# Patient Record
Sex: Male | Born: 1965 | Race: Black or African American | Hispanic: No | Marital: Married | Smoking: Current some day smoker
Health system: Southern US, Community
[De-identification: ages and names within clinical notes are randomized; demographics above are authoritative.]

## PROBLEM LIST (undated history)

## (undated) DIAGNOSIS — H60331 Swimmer's ear, right ear: Secondary | ICD-10-CM

## (undated) DIAGNOSIS — K219 Gastro-esophageal reflux disease without esophagitis: Secondary | ICD-10-CM

## (undated) DIAGNOSIS — Q8500 Neurofibromatosis, unspecified: Secondary | ICD-10-CM

## (undated) DIAGNOSIS — G4730 Sleep apnea, unspecified: Secondary | ICD-10-CM

## (undated) DIAGNOSIS — I10 Essential (primary) hypertension: Secondary | ICD-10-CM

## (undated) HISTORY — PX: OTHER SURGICAL HISTORY: SHX169

## (undated) HISTORY — PX: COLON SURGERY: SHX602

## (undated) HISTORY — DX: Swimmer's ear, right ear: H60.331

---

## 2012-02-27 HISTORY — PX: CARDIAC CATHETERIZATION: SHX172

## 2014-04-07 DIAGNOSIS — Z87442 Personal history of urinary calculi: Secondary | ICD-10-CM

## 2014-04-07 HISTORY — DX: Personal history of urinary calculi: Z87.442

## 2015-05-15 DIAGNOSIS — M25562 Pain in left knee: Secondary | ICD-10-CM | POA: Insufficient documentation

## 2016-05-08 DIAGNOSIS — D334 Benign neoplasm of spinal cord: Secondary | ICD-10-CM | POA: Insufficient documentation

## 2016-05-08 DIAGNOSIS — M461 Sacroiliitis, not elsewhere classified: Secondary | ICD-10-CM | POA: Insufficient documentation

## 2018-07-09 ENCOUNTER — Emergency Department (HOSPITAL_COMMUNITY)
Admission: EM | Admit: 2018-07-09 | Discharge: 2018-07-09 | Disposition: A | Discharge status: Home / Self Care | Payer: Managed Care, Other (non HMO) | Attending: Emergency Medicine | Admitting: Emergency Medicine

## 2018-07-09 ENCOUNTER — Encounter (HOSPITAL_COMMUNITY): Payer: Self-pay | Admitting: Emergency Medicine

## 2018-07-09 ENCOUNTER — Other Ambulatory Visit: Payer: Self-pay

## 2018-07-09 DIAGNOSIS — Z87891 Personal history of nicotine dependence: Secondary | ICD-10-CM | POA: Insufficient documentation

## 2018-07-09 DIAGNOSIS — I10 Essential (primary) hypertension: Secondary | ICD-10-CM | POA: Insufficient documentation

## 2018-07-09 DIAGNOSIS — M791 Myalgia, unspecified site: Secondary | ICD-10-CM

## 2018-07-09 DIAGNOSIS — M545 Low back pain: Secondary | ICD-10-CM | POA: Diagnosis present

## 2018-07-09 HISTORY — DX: Essential (primary) hypertension: I10

## 2018-07-09 LAB — URINALYSIS, ROUTINE W REFLEX MICROSCOPIC
Bilirubin Urine: NEGATIVE
Glucose, UA: NEGATIVE mg/dL
Hgb urine dipstick: NEGATIVE
Ketones, ur: NEGATIVE mg/dL
Leukocytes,Ua: NEGATIVE
Nitrite: NEGATIVE
Protein, ur: NEGATIVE mg/dL
Specific Gravity, Urine: 1.025 (ref 1.005–1.030)
pH: 5 (ref 5.0–8.0)

## 2018-07-09 NOTE — ED Notes (Signed)
Pt anxious to leave. Pt left ED after MD gave pt discharge paperwork.  MD states she reviewed information with patient prior to discharge

## 2018-07-09 NOTE — ED Provider Notes (Signed)
[facility] EMERGENCY DEPARTMENT Provider Note   CSN: [id number] Arrival date & time: 07/09/18  2104    History   Chief Complaint Chief Complaint  Patient presents with  . Back Pain    HPI Taheem Fricke is a 53 y.o. male with history of chronic right low back pain is here for evaluation of bilateral thoracic back pain described as "ache", gnawing, constant, moderate pain.  Onset around 1-2PM today while he was sitting down, several hours after he went fishing.  The pain is constant but significantly worse with movement.  Slightly better if he sits still.  Feels like the pain is now diffuse to his upper trapezius, proximal arms, buttocks and thighs.  Took his morning dose of ibuprofen gabapentin for his chronic back pain but no other interventions.  He denies any falls or trauma.  States recently he changed to a new mattress at home.  He denies any recent injections or surgeries to the back.  He denies any associated headache, congestion, sore throat, cough, chest pain, shortness of breath, abdominal pain, nausea, vomiting, diarrhea.  No saddle anesthesia, no loss of bladder/bowel control, paresthesias or loss of sensation/strength to his extremities.  No fever.  He is concerned that he may be coming down with coronavirus infection.  He feels like his neck lymph nodes are swollen.      HPI  Past Medical History:  Diagnosis Date  . Hypertension     There are no active problems to display for this patient.   History reviewed. No pertinent surgical history.      Home Medications    Prior to Admission medications   Not on File    Family History No family history on file.  Social History Social History   Tobacco Use  . Smoking status: Former Research scientist (life sciences)  . Smokeless tobacco: Never Used  Substance Use Topics  . Alcohol use: Not Currently    Frequency: Never  . Drug use: Never     Allergies   Shellfish allergy   Review of Systems Review of Systems   Musculoskeletal: Positive for myalgias.  All other systems reviewed and are negative.    Physical Exam Updated Vital Signs BP (!) 140/97 (BP Location: Right Arm)   Pulse 91   Temp 98 F (36.7 C) (Oral)   Resp 16   SpO2 96%   Physical Exam Vitals signs and nursing note reviewed.  Constitutional:      Appearance: He is well-developed.     Comments: Non toxic.  HENT:     Head: Normocephalic and atraumatic.     Nose: Nose normal.     Comments: Intranasal mucosa normal.  No rhinorrhea.    Mouth/Throat:     Comments: Moist mucous membranes.  Oropharynx and tonsils normal. Eyes:     Conjunctiva/sclera: Conjunctivae normal.  Neck:     Musculoskeletal: Normal range of motion.     Comments: No cervical lymphadenopathy.  Trachea midline.  Full range of motion of the neck without pain or rigidity. Cardiovascular:     Rate and Rhythm: Normal rate and regular rhythm.     Heart sounds: Normal heart sounds.     Comments: 1+ radial and DP pulses bilaterally. Pulmonary:     Effort: Pulmonary effort is normal.     Breath sounds: Normal breath sounds.  Abdominal:     General: Bowel sounds are normal.     Palpations: Abdomen is soft.     Tenderness: There is no abdominal  tenderness.     Comments: Soft.  No pulsatility.  NT.  No G/R/R. No suprapubic or CVA tenderness. Negative Murphy's and McBurney's  Musculoskeletal: Normal range of motion.     Comments: TL spine: No midline tenderness.  No reproducible paraspinal muscle tenderness.  No increased tone.  Skin:    General: Skin is warm and dry.     Capillary Refill: Capillary refill takes less than 2 seconds.  Neurological:     Mental Status: He is alert.     Comments: Sensation to light touch intact in face, upper and lower extremities.  Strength against resistance intact in upper and lower extremities.  Psychiatric:        Behavior: Behavior normal.      ED Treatments / Results  Labs (all labs ordered are listed, but only  abnormal results are displayed) Labs Reviewed  URINALYSIS, ROUTINE W REFLEX MICROSCOPIC    EKG None  Radiology No results found.  Procedures Procedures (including critical care time)  Medications Ordered in ED Medications - No data to display   Initial Impression / Assessment and Plan / ED Course  I have reviewed the triage vital signs and the nursing notes.  Pertinent labs & imaging results that were available during my care of the patient were reviewed by me and considered in my medical decision making (see chart for details). Willmer Fellers was evaluated in Emergency Department on 07/09/2018 for the symptoms described in the history of present illness. He was evaluated in the context of the global COVID-19 pandemic, which necessitated consideration that the patient might be at risk for infection with the SARS-CoV-2 virus that causes COVID-19. Institutional protocols and algorithms that pertain to the evaluation of patients at risk for COVID-19 are in a state of rapid change based on information released by regulatory bodies including the CDC and federal and state organizations. These policies and algorithms were followed during the patient's care in the ED.      Highest on DDX is muscular spasm versus overuse injury.  Describes diffuse myalgias so also considered early viral process now only presenting itself with myalgias.  He reports subjective swelling to his lymph nodes in his neck.  Also recently changed his mattress more suggestive of MSK etioloy.  Exam is otherwise reassuring.  No midline spinal tenderness.  Strength/sensation intact.  Abdomen benign without pulsatility, suprapubic or CVA tenderness.  Distal pulses symmetric bilaterally.  No red flag features of back pain present such as saddle anesthesia, bladder/bowel incontinence or retention, fevers, h/o cancer, IVDU, preceding trauma or falls, neuro deficits, urinary symptoms.  I have low suspicion for UTI/pyelo, kidney stone,  cauda equina, epidural abscess, dissection as these don't fit clinical picture. Emergent imaging not thought to be indicated today as physical exam reassuring.  Will recommend rest, NSAIDs. Pt concerned about coronavirus infection but has no frank symptoms of this, h/o travel or exposure.  It is possible his myalgias may be the first symptom. Provided reassurance. Pt understands there is no indication for formal testing today.  Pt understands signs and symptoms that would warrant return to ED.  Recommended PCP telemedicine visit for further guidance and self isolation instructions in case he develops symptoms.   Final Clinical Impressions(s) / ED Diagnoses   Final diagnoses:  Myalgia    ED Discharge Orders    None       Kinnie Feil, PA-C 07/09/18 [place], DO 07/10/18 1511

## 2018-07-09 NOTE — ED Triage Notes (Signed)
Pt c/o mid-back pain that started today. Denies urinary symptoms. No chest pain/shortness of breath or fevers.

## 2018-07-09 NOTE — Discharge Instructions (Addendum)
You were seen in the ED for body aches in back, buttocks, legs.   Urine looked normal.  You had no other concerning symptoms today other than body pain.   This may be due to recent mattress change, fishing.  This could be an early viral infection IF you develop virus symptoms like congestion, cough, sore throat, chills, fevers, nausea, vomiting, abdominal pain.   We discussed COVID-19.  Right now you have no symptoms of this and the likelihood that you have it is low. If you do develop symptoms such as fever, cough, congestion, sore throat, nausea, vomiting, headaches you may have it.  If symptoms are mild, stay home and self isolate for a total of 7 days since symptoms began AND 3 days of no fever before you resume essential activities.  If your symptoms worsen or are severe, there is shortness of breath, increased work of breathing, dehydration return to ED.  Take ibuprofen and gabapentin as usual.  Can add [phone] mg acetaminophen for more ache control

## 2018-07-28 ENCOUNTER — Encounter: Payer: Self-pay | Admitting: Family Medicine

## 2018-07-28 ENCOUNTER — Ambulatory Visit (INDEPENDENT_AMBULATORY_CARE_PROVIDER_SITE_OTHER): Payer: Managed Care, Other (non HMO) | Admitting: Family Medicine

## 2018-07-28 DIAGNOSIS — Z1211 Encounter for screening for malignant neoplasm of colon: Secondary | ICD-10-CM

## 2018-07-28 DIAGNOSIS — R413 Other amnesia: Secondary | ICD-10-CM

## 2018-07-28 DIAGNOSIS — Z87898 Personal history of other specified conditions: Secondary | ICD-10-CM

## 2018-07-28 DIAGNOSIS — Z860100 Personal history of colon polyps, unspecified: Secondary | ICD-10-CM | POA: Insufficient documentation

## 2018-07-28 DIAGNOSIS — Q8500 Neurofibromatosis, unspecified: Secondary | ICD-10-CM

## 2018-07-28 DIAGNOSIS — K219 Gastro-esophageal reflux disease without esophagitis: Secondary | ICD-10-CM | POA: Diagnosis not present

## 2018-07-28 DIAGNOSIS — Z7689 Persons encountering health services in other specified circumstances: Secondary | ICD-10-CM | POA: Diagnosis not present

## 2018-07-28 DIAGNOSIS — I10 Essential (primary) hypertension: Secondary | ICD-10-CM | POA: Diagnosis not present

## 2018-07-28 MED ORDER — LOSARTAN POTASSIUM-HCTZ 100-25 MG PO TABS: 1.0000 | ORAL_TABLET | Freq: Every day | ORAL | 3 refills | Status: DC

## 2018-07-28 MED ORDER — OMEPRAZOLE 20 MG PO CPDR: 20.0000 mg | DELAYED_RELEASE_CAPSULE | Freq: Every day | ORAL | 3 refills | Status: DC

## 2018-07-28 MED ORDER — AMLODIPINE BESYLATE 10 MG PO TABS: 10.0000 mg | ORAL_TABLET | Freq: Every day | ORAL | 3 refills | Status: DC

## 2018-07-28 NOTE — Progress Notes (Signed)
Virtual Visit via Video Note  I connected with Rollins Wrightson on 07/28/18 at 11:30 AM EDT by a video enabled telemedicine application and verified that I am speaking with the correct person using two identifiers. Location patient: home Location provider: home office Persons participating in the virtual visit: patient, provider  I discussed the limitations of evaluation and management by telemedicine and the availability of in person appointments. The patient expressed understanding and agreed to proceed.  Chief Complaint  Patient presents with  . Establish Care    est care/ referral to GI? wants blood work? (hx of colon polyps) hasn't taken BP having some head pain.    HPI: Marcus Armstrong is a 53 y.o. male to establish care with our office. His PMH is significant for HTN, substance abuse, and ? Neurofibromatosis. He also endorses a h/o a colon polyp found about 9 years ago on colonoscopy done for rectal bleeding. He had 2 f/u colo since that time and is now due for another 3 year f/u colonoscopy.  Pt is from [facility] and has been in [place] for a few months. He was recently discharged from [facility] about 3 weeks ago where he was receiving inpatient treatment for substance abuse. He is now involved in a intensive outpatient program 3 hours/day 3 days/wk. He is out of work on short-term disability which is set to end on 08/16/18.   Pt has a h/o HTN and is currently on norvasc 10mg  daily, losartan 100mg  daily, and HCTZ 12.5mg  daily. The HCTZ was added about 4 weeks wgo while is was in [employer]. He states his BP was still high and he was told to f/u on this. He believes his last BP readings there were 140-150's/90-100. He was seen in [facility] ER on 4/3 for myalgias and back pain and BP was 140/97 at that time.   Pt requests a referral to neurology due to a h/o ? Neurofibromatosis. He states up until 2014 he was getting annual MRI of brain and spine to monitor over 30 tumors that are present. He also notes  having more difficulty in the past 6-12 mo with memory, recall, word finding. His mother has dementia so pt is concerned about this in himself and also wonders if his h/o substance abuse is a contributing factor.   Patient Active Problem List   Diagnosis Date Noted  . Personal history of colonic polyps 07/28/2018  . Essential hypertension 07/28/2018  . Gastroesophageal reflux disease 07/28/2018    Past Medical History:  Diagnosis Date  . Hypertension     Past Surgical History:  Procedure Laterality Date  . polyp removal      Family History  Problem Relation Age of Onset  . Cancer Father 28       prostate    Social History   Tobacco Use  . Smoking status: Former Research scientist (life sciences)  . Smokeless tobacco: Never Used  Substance Use Topics  . Alcohol use: Not Currently    Frequency: Never  . Drug use: Never     Current Outpatient Medications:  .  amLODipine (NORVASC) 10 MG tablet, Take 1 tablet (10 mg total) by mouth daily., Disp: 90 tablet, Rfl: 3 .  fluticasone (FLONASE) 50 MCG/ACT nasal spray, Place into both nostrils daily., Disp: , Rfl:  .  gabapentin (NEURONTIN) 300 MG capsule, , Disp: , Rfl:  .  ibuprofen (ADVIL) 800 MG tablet, , Disp: , Rfl:  .  meloxicam (MOBIC) 15 MG tablet, , Disp: , Rfl:  .  omeprazole (PRILOSEC) 20 MG capsule, Take 1 capsule (20 mg total) by mouth daily., Disp: 90 capsule, Rfl: 3 .  losartan-hydrochlorothiazide (HYZAAR) 100-25 MG tablet, Take 1 tablet by mouth daily., Disp: 90 tablet, Rfl: 3  Allergies  Allergen Reactions  . Shellfish Allergy Swelling      ROS: See pertinent positives and negatives per HPI.   EXAM:  VITALS per patient if applicable: BP Readings from Last 3 Encounters:  07/09/18 (!) 140/97     GENERAL: alert, oriented, appears well and in no acute distress  HEENT: atraumatic, conjunctiva clear, no obvious abnormalities on inspection of external nose and ears  NECK: normal movements of the head and neck  LUNGS: on  inspection no signs of respiratory distress, breathing rate appears normal, no obvious gross SOB, gasping or wheezing, no conversational dyspnea  CV: no obvious cyanosis  MS: moves all visible extremities without noticeable abnormality  PSYCH/NEURO: pleasant and cooperative, speech and thought processing grossly intact   ASSESSMENT AND PLAN: 1. Encounter to establish care with new doctor - due for CPE, possibly labs although pt states he had "all sorts of lab tests" while he was at [employer]. Will need to obtain records and pt will need to sign records release form. Will see if this can be sent via MyChart or if not mailed to pt  2. Personal history of colonic polyps 3. Screening for colon cancer - Ambulatory referral to Gastroenterology  3. Essential hypertension - not at goal, will increase HCTZ from 12.5mg  to 25mg , cont losartan 100mg  and norvasc 10mg  daily Refill: - amLODipine (NORVASC) 10 MG tablet; Take 1 tablet (10 mg total) by mouth daily.  Dispense: 90 tablet; Refill: 3 Rx: - losartan-hydrochlorothiazide (HYZAAR) 100-25 MG tablet; Take 1 tablet by mouth daily.  Dispense: 90 tablet; Refill: 3 - discussed importance of low sodium diet, regular CV exercise - f/u in 3-4 wks   4. Gastroesophageal reflux disease, esophagitis presence not specified - pt taking prilosec PRN, symptoms mainly at night - advised pt take daily x 2-4 wks then he can stop and reassess Refill: - omeprazole (PRILOSEC) 20 MG capsule; Take 1 capsule (20 mg total) by mouth daily.  Dispense: 90 capsule; Refill: 3  5. 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 Memory difficulties - Ambulatory referral to Neurology  7. History of substance use disorder - recently completed in-patient substance abuse treatment at [facility] and now involved in intensive outpatient program. Congratulated pt on this  positive improvement and encouraged him to continue with counseling       I discussed the assessment and treatment plan with the patient. The patient was provided an opportunity to ask questions and all were answered. The patient agreed with the plan and demonstrated an understanding of the instructions.   The patient was advised to call back or seek an in-person evaluation if the symptoms worsen or if the condition fails to improve as anticipated.   [name], DO

## 2018-07-29 ENCOUNTER — Telehealth: Payer: Self-pay | Admitting: Family Medicine

## 2018-07-29 NOTE — Progress Notes (Signed)
Pt was sent records of release form to sign and was asked if he could fill out and send back to [place]. Sent that form in mail today.

## 2018-07-29 NOTE — Telephone Encounter (Signed)
Copied from [place] [id number]. Topic: Quick Communication - See Telephone Encounter >> Jul 29, 2018  4:13 PM Blase Mess A wrote: CRM for notification. See Telephone encounter for: 07/29/18.Patient is calling back he missed the call. Please advise. [phone] (M)

## 2018-07-30 ENCOUNTER — Telehealth: Payer: Self-pay

## 2018-07-30 NOTE — Telephone Encounter (Signed)
Pt did not discuss this with me at appt on 4/22. He can be seen easily here at our office by sports med, Dr. Raeford Razor. Please schedule him appt if he is agreeable. xrays could also be done at time of OV if Dr. Raeford Razor feels they are needed

## 2018-07-30 NOTE — Telephone Encounter (Signed)
Dr. Loletha Grayer please advise   Copied from [place] [id number]. Topic: Referral - Request for Referral >> Jul 30, 2018 11:11 AM Alanda Slim E wrote: Has patient seen PCP for this complaint? No  *If NO, is insurance requiring patient see PCP for this issue before PCP can refer them? Referral for which specialty: orthopedic  Preferred provider/office:  Reason for referral: right knee pain. Since April  Pt states the knee pops/clicks and is painful. Knee throbs and aches when sleeping. Pt has had rounds of injections in the past and they have helped his left knee but not his right/ please advise

## 2018-07-30 NOTE — Telephone Encounter (Signed)
Pt made appointment to see Dr. Raeford Razor 4/29

## 2018-08-04 ENCOUNTER — Ambulatory Visit: Payer: Self-pay | Admitting: Family Medicine

## 2018-08-16 ENCOUNTER — Encounter: Payer: Self-pay | Admitting: Nurse Practitioner

## 2018-08-16 ENCOUNTER — Ambulatory Visit (INDEPENDENT_AMBULATORY_CARE_PROVIDER_SITE_OTHER): Payer: Managed Care, Other (non HMO) | Admitting: Nurse Practitioner

## 2018-08-16 ENCOUNTER — Encounter: Payer: Self-pay | Admitting: Family Medicine

## 2018-08-16 ENCOUNTER — Telehealth: Payer: Self-pay | Admitting: Family Medicine

## 2018-08-16 DIAGNOSIS — R0989 Other specified symptoms and signs involving the circulatory and respiratory systems: Secondary | ICD-10-CM | POA: Diagnosis not present

## 2018-08-16 DIAGNOSIS — R0601 Orthopnea: Secondary | ICD-10-CM | POA: Diagnosis not present

## 2018-08-16 DIAGNOSIS — R09A2 Foreign body sensation, throat: Secondary | ICD-10-CM

## 2018-08-16 DIAGNOSIS — R198 Other specified symptoms and signs involving the digestive system and abdomen: Secondary | ICD-10-CM

## 2018-08-16 NOTE — Patient Instructions (Signed)
F/up in office tomorrow.

## 2018-08-16 NOTE — Progress Notes (Signed)
Virtual Visit via Video Note  I connected with Marcus Armstrong on 08/16/18 at 11:00 AM EDT by a video enabled telemedicine application and verified that I am speaking with the correct person using two identifiers.  Location: Patient: Home Provider: Office   I discussed the limitations of evaluation and management by telemedicine and the availability of in person appointments. The patient expressed understanding and agreed to proceed.  History of Present Illness:  Shortness of Breath  This is a new problem. The current episode started more than 1 month ago. The problem occurs intermittently. The problem has been gradually worsening. Associated symptoms include orthopnea and PND. Pertinent negatives include no chest pain, fever, headaches, leg swelling, neck pain, rhinorrhea, sore throat, sputum production, swollen glands, syncope or wheezing. The symptoms are aggravated by lying flat (or laying on left side). He has tried nothing for the symptoms. His past medical history is significant for allergies. There is no history of aspirin allergies, asthma, bronchiolitis, CAD, chronic lung disease, COPD, DVT, a heart failure, pneumonia or a recent surgery.  Also reports fullness in throat which is worse if supine position and laying on left side. No dysphagia, no drooling, no cough. Has sensation of air trapping during expiration. Wakes up gagging and coughing at night. Hx of GERD: controlled with omeprazole daily Hx of seasonal allergies: nasal congestion controlled with flonase and zyrtec. Former tobacco use: <1/4ppd x 17yrs, quit 97months ago.  Scores 17 on Epsworth Sleepiness Scale today  Observations/Objective: Physical Exam  Constitutional: He is oriented to person, place, and time. No distress.  HENT:  Unable to visualize posterior pharynx during video call  Pulmonary/Chest: Effort normal.  Musculoskeletal:        General: No edema.  Neurological: He is alert and oriented to person, place,  and time.  Psychiatric: He has a normal mood and affect. His behavior is normal. Thought content normal.   Assessment and Plan: Marcus Armstrong was seen today for airway obstruction.  Diagnoses and all orders for this visit:  Marcus Armstrong Chest 2 View; Future  Globus pharyngeus   Follow Up Instructions: Scheduled to be evaluated in office tomorrow. Obtain CXR Consider referral to neurology for sleep study vs ENT vs PFTs?   I discussed the assessment and treatment plan with the patient. The patient was provided an opportunity to ask questions and all were answered. The patient agreed with the plan and demonstrated an understanding of the instructions.   The patient was advised to call back or seek an in-person evaluation if the symptoms worsen or if the condition fails to improve as anticipated.   Marcus Lacy, NP

## 2018-08-16 NOTE — Telephone Encounter (Signed)
Called patient and no vm. Calling to schedule virtual visit for throat swelling obstructing breathing. Pt see Dr. Loletha Grayer but she is not in the office today, will check another doctors schedule.

## 2018-08-17 ENCOUNTER — Encounter: Payer: Self-pay | Admitting: Nurse Practitioner

## 2018-08-17 ENCOUNTER — Ambulatory Visit (INDEPENDENT_AMBULATORY_CARE_PROVIDER_SITE_OTHER): Payer: Managed Care, Other (non HMO) | Admitting: Nurse Practitioner

## 2018-08-17 VITALS — BP 122/66 | HR 72 | Temp 98.0°F | Ht 72.0 in | Wt 257.6 lb

## 2018-08-17 DIAGNOSIS — R0989 Other specified symptoms and signs involving the circulatory and respiratory systems: Secondary | ICD-10-CM

## 2018-08-17 DIAGNOSIS — K219 Gastro-esophageal reflux disease without esophagitis: Secondary | ICD-10-CM

## 2018-08-17 DIAGNOSIS — R198 Other specified symptoms and signs involving the digestive system and abdomen: Secondary | ICD-10-CM

## 2018-08-17 DIAGNOSIS — J353 Hypertrophy of tonsils with hypertrophy of adenoids: Secondary | ICD-10-CM | POA: Diagnosis not present

## 2018-08-17 DIAGNOSIS — R400 Somnolence: Secondary | ICD-10-CM

## 2018-08-17 DIAGNOSIS — R09A2 Foreign body sensation, throat: Secondary | ICD-10-CM

## 2018-08-17 MED ORDER — OMEPRAZOLE 20 MG PO CPDR: 20.0000 mg | DELAYED_RELEASE_CAPSULE | Freq: Two times a day (BID) | ORAL | 0 refills | Status: AC

## 2018-08-17 MED ORDER — PREDNISONE 10 MG (21) PO TBPK: ORAL_TABLET | ORAL | 0 refills | Status: DC

## 2018-08-17 NOTE — Progress Notes (Signed)
Subjective:  Patient ID: Marcus Armstrong, male    DOB: 1965/11/07  Age: 53 y.o. MRN: [id number]  CC: Follow-up (follow on SOB at night when lay down flat,swelling around neck area/ ibuprofen consult? )  HPI Marcus Armstrong is here for evaluation of throat fullness and gagging in supine position and left lateral recumbent position. He denies any SOB with exertion or cough or fever. Hx of GERD, current use of omeprazole daily. Hx of seasonal allergies: current use of flonase and zyrtec Epsworth Sleepiness score: 17 Former tobacco use: quit 70months ago.  Reviewed past Medical, Social and Family history today.  Outpatient Medications Prior to Visit  Medication Sig Dispense Refill  . amLODipine (NORVASC) 10 MG tablet Take 1 tablet (10 mg total) by mouth daily. 90 tablet 3  . cetirizine (ZYRTEC) 10 MG tablet TK 1 T PO  D    . fluticasone (FLONASE) 50 MCG/ACT nasal spray Place into both nostrils daily.    Marland Kitchen gabapentin (NEURONTIN) 300 MG capsule     . ibuprofen (ADVIL) 800 MG tablet     . losartan-hydrochlorothiazide (HYZAAR) 100-25 MG tablet Take 1 tablet by mouth daily. 90 tablet 3  . meloxicam (MOBIC) 15 MG tablet     . omeprazole (PRILOSEC) 20 MG capsule Take 1 capsule (20 mg total) by mouth daily. 90 capsule 3   No facility-administered medications prior to visit.     ROS See HPI  Objective:  BP 122/66   Pulse 72   Temp 98 F (36.7 C) (Oral)   Ht 6' (1.829 m)   Wt 257 lb 9.6 oz (116.8 kg)   SpO2 96%   BMI 34.94 kg/m   BP Readings from Last 3 Encounters:  08/17/18 122/66  07/09/18 (!) 140/97    Wt Readings from Last 3 Encounters:  08/17/18 257 lb 9.6 oz (116.8 kg)    Physical Exam Constitutional:      Appearance: He is obese.  HENT:     Right Ear: External ear normal.     Left Ear: External ear normal.     Mouth/Throat:     Mouth: Mucous membranes are moist.     Pharynx: No oropharyngeal exudate, posterior oropharyngeal erythema or uvula swelling.     Tonsils: No  tonsillar exudate or tonsillar abscesses. 3+ on the right. 3+ on the left.  Neck:     Musculoskeletal: Normal range of motion and neck supple.  Cardiovascular:     Rate and Rhythm: Normal rate.  Pulmonary:     Effort: Pulmonary effort is normal.     Breath sounds: Normal breath sounds. No stridor.  Musculoskeletal:     Right lower leg: No edema.     Left lower leg: No edema.  Lymphadenopathy:     Cervical: No cervical adenopathy.  Neurological:     Mental Status: He is oriented to person, place, and time.  Psychiatric:        Mood and Affect: Mood normal.        Behavior: Behavior normal.    No results found for: WBC, HGB, HCT, PLT, GLUCOSE, CHOL, TRIG, HDL, LDLDIRECT, LDLCALC, ALT, AST, NA, K, CL, CREATININE, BUN, CO2, TSH, PSA, INR, GLUF, HGBA1C, MICROALBUR  Assessment & Plan:   Marcus Armstrong was seen today for follow-up.  Diagnoses and all orders for this visit:  Enlarged tonsils and adenoids -     Ambulatory referral to ENT -     predniSONE (STERAPRED UNI-PAK 21 TAB) 10 MG (21) TBPK tablet; As directed  on package  Globus pharyngeus -     Ambulatory referral to ENT -     predniSONE (STERAPRED UNI-PAK 21 TAB) 10 MG (21) TBPK tablet; As directed on package  Daytime somnolence -     Ambulatory referral to Sleep Studies  Gastroesophageal reflux disease, esophagitis presence not specified -     omeprazole (PRILOSEC) 20 MG capsule; Take 1 capsule (20 mg total) by mouth 2 (two) times daily before a meal.   I have changed [name]'s omeprazole. I am also having him start on predniSONE. Additionally, I am having him maintain his gabapentin, meloxicam, ibuprofen, fluticasone, amLODipine, losartan-hydrochlorothiazide, and cetirizine.  Meds ordered this encounter  Medications  . predniSONE (STERAPRED UNI-PAK 21 TAB) 10 MG (21) TBPK tablet    Sig: As directed on package    Dispense:  21 tablet    Refill:  0    Order Specific Question:   Supervising Provider    Answer:   Lucille Passy [[id number]]  . omeprazole (PRILOSEC) 20 MG capsule    Sig: Take 1 capsule (20 mg total) by mouth 2 (two) times daily before a meal.    Dispense:  180 capsule    Refill:  0    Order Specific Question:   Supervising Provider    Answer:   Lucille Passy [[id number]]    Problem List Items Addressed This Visit      Digestive   Gastroesophageal reflux disease   Relevant Medications   omeprazole (PRILOSEC) 20 MG capsule    Other Visit Diagnoses    Enlarged tonsils and adenoids    -  Primary   Relevant Medications   predniSONE (STERAPRED UNI-PAK 21 TAB) 10 MG (21) TBPK tablet   Other Relevant Orders   Ambulatory referral to ENT   Globus pharyngeus       Relevant Medications   predniSONE (STERAPRED UNI-PAK 21 TAB) 10 MG (21) TBPK tablet   Other Relevant Orders   Ambulatory referral to ENT   Daytime somnolence       Relevant Orders   Ambulatory referral to Sleep Studies      Follow-up: Return if symptoms worsen or fail to improve.  Wilfred Lacy, NP

## 2018-08-17 NOTE — Patient Instructions (Addendum)
Start oral prednisone to help decrease inflammation of tonsils. Increase omeprazole to BID. No need for CXR at this time.  You will be contacted to schedule appt with ENT and for sleep study.  No need for CXR at this time.  F/up with pcp in 1week.   Enlarged Adenoids  Having enlarged adenoids means that your adenoids are larger than usual. The adenoids are areas of soft tissue located high in the back of the throat, behind the nose and the roof of the mouth. They are part of the body's natural disease-fighting system (immune system). Adenoids trap germs, such as bacteria and viruses, as they pass through the throat. This can make the adenoids inflamed and enlarged. Adenoids also have immune cells that produce blood proteins (antibodies) that help destroy germs. What are the causes? This condition may be caused by:  Viral or bacterial infections.  Allergies.  Smoking or exposure to otherirritants.  Backup of stomach fluids or food into the esophagus (gastroesophageal reflux).  Cancer. This is very rare. What increases the risk? You are more likely to develop this condition if you:  Have allergies.  Are often exposed to bacterial or viral infections.  Have gastroesophageal reflux disease (GERD).  Smoke.  Are HIV positive. What are the signs or symptoms? Symptoms of this condition may include:  Dry, cracked lips and mouth.  Restlessness while sleeping.  Snoring.  Bad breath.  Frequent ear infections.  Runny nose or nasal congestion.  Enlarged tonsils.  Difficulty hearing.  Persistent coughing.  Nasal voice.  Mouth breathing.  Fever. How is this diagnosed? This condition may be diagnosed based on your symptoms and medical history, including how many sore throats you have had over the past 1-3 years.  Your health care provider may also do a physical exam to check your ears, nose, and throat using a small mirror or a flexible scope  (nasopharyngoscope).  You may also have other tests. These may include: ? An X-ray of your throat. ? A throat culture to check for a bacterial infection. ? Blood tests to check for viral infections, such as mononucleosis. ? A sleep study. How is this treated? Depending on the cause, the condition may go away in 5-7 days without treatment. If needed, this condition may be treated with:  Corticosteroid nasal spray or other allergy medicines, such as antihistamines to help reduce swelling.  Antibiotic medicines to treat a bacterial infection.  Surgery to remove the adenoids (adenoidectomy) and possibly the tonsils (tonsillectomy). This may be done if you have: ? Extreme pain. ? Breathing problems. ? Frequent infections.  Lifestyle and diet changes.  Medicine to help treat reflux. Follow these instructions at home:  Take over-the-counter and prescription medicines only as told by your health care provider.  If you were prescribed an antibiotic medicine, take it as told by your health care provider. Do not stop using the antibiotic even if you start to feel better.  Do not use any products that contain nicotine or tobacco, such as cigarettes, e-cigarettes, and chewing tobacco. If you need help quitting, ask your health care provider.  Keep all follow-up visits as told by your health care provider. This is important. Contact a health care provider if you have:  Ear or sinus infections that keep coming back (recurring).  Hearing loss.  Trouble sleeping. Get help right away if you have:  Severe pain.  Trouble talking, breathing, or swallowing. Summary  Having enlarged adenoids means that your adenoids are larger than usual.  Depending on the cause, the condition may go away in 5-7 days without treatment. If needed, the condition may be treated with medicines, lifestyle changes, or surgery.  Take over-the-counter and prescription medicines only as told by your health care  provider.  Contact a health care provider if you have recurring ear or sinus infections, hearing loss, or trouble sleeping.  Keep all follow-up visits as told by your health care provider. This is important. This information is not intended to replace advice given to you by your health care provider. Make sure you discuss any questions you have with your health care provider. Document Released: 03/07/2005 Document Revised: 10/14/2017 Document Reviewed: 10/14/2017 Elsevier Interactive Patient Education  2019 [employer].

## 2018-08-18 ENCOUNTER — Ambulatory Visit (INDEPENDENT_AMBULATORY_CARE_PROVIDER_SITE_OTHER): Payer: Managed Care, Other (non HMO) | Admitting: Family Medicine

## 2018-08-18 ENCOUNTER — Encounter: Payer: Self-pay | Admitting: Family Medicine

## 2018-08-18 ENCOUNTER — Ambulatory Visit (INDEPENDENT_AMBULATORY_CARE_PROVIDER_SITE_OTHER): Payer: Managed Care, Other (non HMO)

## 2018-08-18 VITALS — BP 138/84 | HR 69 | Temp 98.1°F | Ht 72.0 in

## 2018-08-18 DIAGNOSIS — M25561 Pain in right knee: Secondary | ICD-10-CM | POA: Diagnosis not present

## 2018-08-18 DIAGNOSIS — M7631 Iliotibial band syndrome, right leg: Secondary | ICD-10-CM

## 2018-08-18 MED ORDER — DICLOFENAC SODIUM 2 % TD SOLN: 1.0000 "application " | Freq: Two times a day (BID) | TRANSDERMAL | 3 refills | Status: AC

## 2018-08-18 NOTE — Progress Notes (Signed)
Marcus Armstrong - 53 y.o. male MRN [id number]  Date of birth: 1965/09/30  SUBJECTIVE:  Including CC & ROS.  Chief Complaint  Patient presents with  . Pain    right knee pain/years/ worse since April/ feels like patella gets caught up and pops    Marcus Armstrong is a 53 y.o. male that is presenting with right knee pain.  The pain is been ongoing for about a month.  He felt the pain after he played some pickup basketball.  He denies any specific injury.  He felt the pain after playing.  The pain is occurring over the lateral aspect of the knee.  Denies any locking or giving way.  He received an injection into the knee about a month ago.  He does not have any significant pain today.  He only feels the pain when he is walking.  He has to stand all day at work and works at [employer].  Has not had surgery on the knee.  Has had mild swelling.  Has been taking meloxicam.  The pain can be throbbing.   Review of Systems  Constitutional: Negative for fever.  HENT: Negative for congestion.   Respiratory: Negative for cough.   Cardiovascular: Negative for chest pain.  Gastrointestinal: Negative for abdominal pain.  Musculoskeletal: Negative for back pain.  Skin: Negative for color change.  Neurological: Negative for weakness.  Hematological: Negative for adenopathy.    HISTORY: Past Medical, Surgical, Social, and Family History Reviewed & Updated per EMR.   Pertinent Historical Findings include:  Past Medical History:  Diagnosis Date  . Hypertension     Past Surgical History:  Procedure Laterality Date  . polyp removal      Allergies  Allergen Reactions  . Cat Hair Extract Hives, Other (See Comments), Rash, Shortness Of Breath and Swelling  . Shellfish Allergy Swelling    Family History  Problem Relation Age of Onset  . Cancer Father 34       prostate     Social History   Socioeconomic History  . Marital status: Divorced    Spouse name: Not on file  . Number of children: Not on  file  . Years of education: Not on file  . Highest education level: Not on file  Occupational History  . Not on file  Social Needs  . Financial resource strain: Not on file  . Food insecurity:    Worry: Not on file    Inability: Not on file  . Transportation needs:    Medical: Not on file    Non-medical: Not on file  Tobacco Use  . Smoking status: Former Smoker    Packs/day: 0.25    Years: 30.00    Pack years: 7.50    Types: Cigarettes    Last attempt to quit: 05/08/2018    Years since quitting: 0.2  . Smokeless tobacco: Never Used  Substance and Sexual Activity  . Alcohol use: Not Currently    Frequency: Never  . 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 Sexual activity: Not on file  Lifestyle  . Physical activity:    Days per week: Not on file    Minutes per session: Not on file  . Stress: Not on file  Relationships  . Social connections:    Talks on phone: Not on file    Gets together: Not on file    Attends religious service:  Not on file    Active member of club or organization: Not on file    Attends meetings of clubs or organizations: Not on file    Relationship status: Not on file  . Intimate partner violence:    Fear of current or ex partner: Not on file    Emotionally abused: Not on file    Physically abused: Not on file    Forced sexual activity: Not on file  Other Topics Concern  . Not on file  Social History Narrative  . Not on file     PHYSICAL EXAM:  VS: BP 138/84   Pulse 69   Temp 98.1 F (36.7 C) (Oral)   Ht 6' (1.829 m)   SpO2 96%   BMI 34.94 kg/m  Physical Exam Gen: NAD, alert, cooperative with exam, well-appearing ENT: normal lips, normal nasal mucosa,  Eye: normal EOM, normal conjunctiva and lids CV:  no edema, +2 pedal pulses   Resp: no accessory muscle use, non-labored,  Skin: no rashes, no areas of induration  Neuro: normal tone, normal sensation to  touch Psych:  normal insight, alert and oriented MSK:  Right knee: Normal to inspection with no erythema or effusion or obvious bony abnormalities. Palpation normal with no warmth, joint line tenderness, patellar tenderness, or condyle tenderness. ROM full in flexion and extension and lower leg rotation. Ligaments with solid consistent endpoints including  LCL, MCL. Negative Mcmurray's tests. Positive Noble's test  Non painful patellar compression. Patellar glide without crepitus. Patellar and quadriceps tendons unremarkable. Hamstring and quadriceps strength is normal.  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 No significant degenerative changes on [place]. Less likely for meniscus.  - pennsaid - provided samples of duexis and pennsaid  - counseled on HEP, supportive care and compression  - could consider nitro patches, imaging or PT if no improvement or custom orthotics.

## 2018-08-18 NOTE — Patient Instructions (Signed)
Nice to meet you  Please try ice on the area  Please try the exercises  Please try the rub on medicine  Please try compression  Please follow up if you would like to have custom orthotics made Pleas send me a message on MyChart with any questions or updates  Please see me back in 1-2 months if no better.

## 2018-08-18 NOTE — Assessment & Plan Note (Signed)
Findings suggestive of IT band syndrome. No significant degenerative changes on [place]. Less likely for meniscus.  - pennsaid - provided samples of duexis and pennsaid  - counseled on HEP, supportive care and compression  - could consider nitro patches, imaging or PT if no improvement or custom orthotics.

## 2018-08-24 ENCOUNTER — Encounter: Payer: Self-pay | Admitting: Nurse Practitioner

## 2018-08-31 ENCOUNTER — Encounter: Payer: Self-pay | Admitting: Family Medicine

## 2018-09-02 ENCOUNTER — Telehealth: Payer: Self-pay | Admitting: Neurology

## 2018-09-02 ENCOUNTER — Encounter: Payer: Self-pay | Admitting: Family Medicine

## 2018-09-02 ENCOUNTER — Ambulatory Visit (INDEPENDENT_AMBULATORY_CARE_PROVIDER_SITE_OTHER): Payer: Managed Care, Other (non HMO) | Admitting: Family Medicine

## 2018-09-02 VITALS — BP 130/82 | HR 69 | Temp 97.6°F | Ht 72.0 in | Wt 254.0 lb

## 2018-09-02 DIAGNOSIS — M549 Dorsalgia, unspecified: Secondary | ICD-10-CM

## 2018-09-02 DIAGNOSIS — M545 Low back pain: Secondary | ICD-10-CM

## 2018-09-02 DIAGNOSIS — G8929 Other chronic pain: Secondary | ICD-10-CM | POA: Diagnosis not present

## 2018-09-02 DIAGNOSIS — M62830 Muscle spasm of back: Secondary | ICD-10-CM | POA: Diagnosis not present

## 2018-09-02 MED ORDER — CYCLOBENZAPRINE HCL 10 MG PO TABS: 10.0000 mg | ORAL_TABLET | Freq: Three times a day (TID) | ORAL | 0 refills | Status: DC | PRN

## 2018-09-02 MED ORDER — IBUPROFEN 800 MG PO TABS: 800.0000 mg | ORAL_TABLET | Freq: Three times a day (TID) | ORAL | 0 refills | Status: DC | PRN

## 2018-09-02 MED ORDER — PREDNISONE 20 MG PO TABS: ORAL_TABLET | ORAL | 0 refills | Status: DC

## 2018-09-02 NOTE — Telephone Encounter (Signed)
Due to current COVID 19 pandemic, our office is severely reducing in office visits, in order to minimize the risk to our patients and healthcare providers.    Pt understands that although there may be some limitations with this type of visit, we will take all precautions to reduce any security or privacy concerns.  Pt understands that this will be treated like an in office visit and we will file with pt's insurance, and there may be a patient responsible charge related to this service.  Pt will be using [employer] for their virtual visit. Pt understands that the nurse will be calling to go over pt's chart. Pt was informed to measure neck size in inches prior to appointment with physician. Pt is requesting a text message be sent to his cell number which is [phone] and pt's phone carrier is [employer].

## 2018-09-02 NOTE — Patient Instructions (Signed)

## 2018-09-02 NOTE — Progress Notes (Signed)
Marcus Armstrong is a 53 y.o. male  Chief Complaint  Patient presents with  . Pain    pain in lower back/ feels like someone is twisting his spine/ hx root nerve tumours/ 800 mg ibuprofen    HPI: Marcus Armstrong is a 53 y.o. male complains of B/L low back pain x 1 week. Pt drove 3 hours to [place] 1 week ago and states after that time his back "flared" and he has been in pain since then. He drove back to [place] yesterday. He is still in pain but it is slightly improved today. He describes the pain as a "spasm" and "pulling that doesn't let go". Pain is located in lower back and wraps around to hips and lower abdomen at times. No radiation of pain into legs.  + tingling in bottom of Lt foot.  No tripping or falling. Pt does not feel off balance.  Pt took ibuprofen 800mg  around 2am today which was helpful. Yesterday he took ibuprofen 200mg  tab and 2 aleve. Pt states he normally takes meloxicam and flexeril for his back pain. He did not have these meds when out of town in [place] so he borrowed flexeril from his sister.  Pt states "I live in pain with my back". Pt notes a h/o root nerve tumors.  No change in bowel or bladder function. No saddle anesthesia.   Pt states he has forms for work that he needs to have completed. These related to his ability to return to work, restrictions at work, Social research officer, government. He will drop forms off.  Past Medical History:  Diagnosis Date  . Hypertension     Past Surgical History:  Procedure Laterality Date  . polyp removal      Social History   Socioeconomic History  . Marital status: Divorced    Spouse name: Not on file  . Number of children: Not on file  . Years of education: Not on file  . Highest education level: Not on file  Occupational History  . Not on file  Social Needs  . Financial resource strain: Not on file  . Food insecurity:    Worry: Not on file    Inability: Not on file  . Transportation needs:    Medical: Not on file    Non-medical: Not on file   Tobacco Use  . Smoking status: Former Smoker    Packs/day: 0.25    Years: 30.00    Pack years: 7.50    Types: Cigarettes    Last attempt to quit: 05/08/2018    Years since quitting: 0.3  . Smokeless tobacco: Never Used  Substance and Sexual Activity  . Alcohol use: Not Currently    Frequency: Never  . 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 Sexual activity: Not on file  Lifestyle  . Physical activity:    Days per week: Not on file    Minutes per session: Not on file  . Stress: Not on file  Relationships  . Social connections:    Talks on phone: Not on file    Gets together: Not on file    Attends religious service: Not on file    Active member of club or organization: Not on file    Attends meetings of clubs or organizations: Not on file    Relationship status: Not on file  . Intimate partner violence:    Fear of current or ex  partner: Not on file    Emotionally abused: Not on file    Physically abused: Not on file    Forced sexual activity: Not on file  Other Topics Concern  . Not on file  Social History Narrative  . Not on file    Family History  Problem Relation Age of Onset  . Cancer Father 80       prostate      There is no immunization history on file for this patient.  Outpatient Encounter Medications as of 09/02/2018  Medication Sig  . amLODipine (NORVASC) 10 MG tablet Take 1 tablet (10 mg total) by mouth daily.  . cetirizine (ZYRTEC) 10 MG tablet TK 1 T PO  D  . Diclofenac Sodium (PENNSAID) 2 % SOLN Place 1 application onto the skin 2 (two) times daily.  . fluticasone (FLONASE) 50 MCG/ACT nasal spray Place into both nostrils daily.  Marland Kitchen gabapentin (NEURONTIN) 300 MG capsule   . ibuprofen (ADVIL) 800 MG tablet   . losartan-hydrochlorothiazide (HYZAAR) 100-25 MG tablet Take 1 tablet by mouth daily.  . meloxicam (MOBIC) 15 MG tablet   . omeprazole (PRILOSEC) 20 MG capsule Take 1  capsule (20 mg total) by mouth 2 (two) times daily before a meal.  . [DISCONTINUED] predniSONE (STERAPRED UNI-PAK 21 TAB) 10 MG (21) TBPK tablet As directed on package   No facility-administered encounter medications on file as of 09/02/2018.      ROS: Pertinent positives and negatives noted in HPI. Remainder of ROS non-contributory    Allergies  Allergen Reactions  . Cat Hair Extract Hives, Other (See Comments), Rash, Shortness Of Breath and Swelling  . Shellfish Allergy Swelling    BP 130/82   Pulse 69   Temp 97.6 F (36.4 C) (Oral)   Ht 6' (1.829 m)   Wt 254 lb (115.2 kg)   SpO2 98%   BMI 34.45 kg/m   Physical Exam  Constitutional: He is oriented to person, place, and time. He appears well-developed and well-nourished.  Abdominal: Soft. Bowel sounds are normal. He exhibits no distension and no mass. There is no abdominal tenderness.  Musculoskeletal:        General: Tenderness (TTP over B/L lumbar paraspinal musculature, restricted ROM in flexion, Rt sidebending, Rt rotation; neg SLR B/L) present. No edema.  Neurological: He is alert and oriented to person, place, and time. He has normal strength and normal reflexes. Coordination normal.  Psychiatric: He has a normal mood and affect. His behavior is normal.     A/P:  1. Chronic bilateral low back pain without sciatica 2. Musculoskeletal back pain 3. Muscle spasm of back - ice/heat - stretching/ROM exercises daily - reviewed with pt and included in AVS Rx: - cyclobenzaprine (FLEXERIL) 10 MG tablet; Take 1 tablet (10 mg total) by mouth 3 (three) times daily as needed for muscle spasms.  Dispense: 60 tablet; Refill: 0 - ibuprofen (ADVIL) 800 MG tablet; Take 1 tablet (800 mg total) by mouth every 8 (eight) hours as needed.  Dispense: 90 tablet; Refill: 0 - pt to take TID with food x 7-10 days - predniSONE (DELTASONE) 20 MG tablet; 3 tabs po x 2 days, 2 tabs po x 2 days, 1 tab po x 2 days, 1/2 tab po x 2 days  Dispense: 13  tablet; Refill: 0 - Ambulatory referral to Physical Therapy - f/u if symptoms worsen or do not improve in 2 wks Discussed plan and reviewed medications with patient, including risks, benefits, and potential side  effects. Pt expressed understand. All questions answered.

## 2018-09-03 ENCOUNTER — Encounter: Payer: Self-pay | Admitting: Family Medicine

## 2018-09-06 ENCOUNTER — Ambulatory Visit: Payer: Managed Care, Other (non HMO) | Admitting: Physical Therapy

## 2018-09-06 NOTE — Telephone Encounter (Signed)
I called pt. Pt's meds, allergies, and PMH were updated.  Pt has never had a sleep study but does endorse snoring.  Pt reports that his weight is 254 lbs and he is 6'0.  Pt was instructed on how to measure his neck prior to his appt tomorrow.  Epworth Sleepiness Scale 0= would never doze 1= slight chance of dozing 2= moderate chance of dozing 3= high chance of dozing  Sitting and reading: 2 Watching TV: 3 Sitting inactive in a public place (ex. Theater or meeting): 2 As a passenger in a car for an hour without a break: 1 Lying down to rest in the afternoon: 3 Sitting and talking to someone: 0 Sitting quietly after lunch (no alcohol): 3 In a car, while stopped in traffic: 0 Total: 14  FSS: 52

## 2018-09-07 ENCOUNTER — Ambulatory Visit (INDEPENDENT_AMBULATORY_CARE_PROVIDER_SITE_OTHER): Payer: Managed Care, Other (non HMO) | Admitting: Neurology

## 2018-09-07 ENCOUNTER — Encounter: Payer: Self-pay | Admitting: Neurology

## 2018-09-07 ENCOUNTER — Other Ambulatory Visit: Payer: Self-pay

## 2018-09-07 DIAGNOSIS — R0689 Other abnormalities of breathing: Secondary | ICD-10-CM

## 2018-09-07 DIAGNOSIS — R351 Nocturia: Secondary | ICD-10-CM

## 2018-09-07 DIAGNOSIS — G4719 Other hypersomnia: Secondary | ICD-10-CM | POA: Diagnosis not present

## 2018-09-07 DIAGNOSIS — E669 Obesity, unspecified: Secondary | ICD-10-CM

## 2018-09-07 DIAGNOSIS — R0683 Snoring: Secondary | ICD-10-CM

## 2018-09-07 NOTE — Patient Instructions (Signed)
Given verbally, during today's virtual video-based encounter, with verbal feedback received.   

## 2018-09-07 NOTE — Progress Notes (Signed)
Star Age, MD, PhD [facility] [address], [address], [place] [address]   Virtual Visit via Video Note on 09/07/2018:  I connected with Marcus Armstrong on 09/07/18 at  9:00 AM EDT by a video enabled telemedicine application and verified that I am speaking with the correct person using two identifiers.   I discussed the limitations of evaluation and management by telemedicine and the availability of in person appointments. The patient expressed understanding and agreed to proceed.  History of Present Illness: Marcus Armstrong is a 53 year old right-handed gentleman with an underlying medical history of hypertension and obesity, who presents for a virtual, video base appointment via [email or web address] for evaluation of his sleep disorder, in particular, concern for underlying obstructive sleep apnea.  The patient is unaccompanied today and joints via cell phone from his parked car. I am located in my office.  He is referred by his primary care nurse practitioner, Wilfred Lacy. He reports snoring and excessive daytime somnolence.  He reports waking up with a sense of gasping for air.  His Epworth sleepiness score is 14 out of 24, fatigue severity score is 52 out of 63.  He reports a family history of OSA in his sister who has a CPAP machine.  He has had a variable sleep schedule, he is currently on short-term disability, has worked nights before, works in Psychologist, educational as a Furniture conservator/restorer.  When he worked nights he would not go to bed until noon sometimes, would be experiencing some sleepiness and even drowsiness at the wheel when driving home from work in the mornings, rise time was around 430 or 5 PM, currently he is in bed generally between 10 and 12 and rise time is generally between 530 and 6 AM.  Sometimes he goes back to sleep and has an alarm set for 7:50 AM.  He has a TV in his bedroom, lives with roommates, does not share his bedroom, has the TV on at night but it  will go off on a timer typically.  He does not drink caffeine on a daily basis, occasional diet green tea or soda, most soda is non-caffeine-containing.  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  He very rarely smokes cigarettes and denies any illicit drug use currently.  He does take ibuprofen on a regular basis and has taken meloxicam as well.  He denies any blood in the stool or recurrent cough.  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  He has nocturia on average once or twice per night, denies recurrent morning headaches, has no pets.  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  When he was in rehab he indicates significant weight gain of about 20 to 25 pounds.  He is trying to lose weight currently, has cut back on carbs.   His Past Medical History Is Significant For: Past Medical History:  Diagnosis Date   Hypertension     His Past Surgical History Is Significant For: Past Surgical History:  Procedure Laterality Date   polyp removal      His Family History Is Significant For: Family History  Problem Relation Age of Onset   Cancer Father 2       prostate  His Social History Is Significant For: Social History   Socioeconomic History   Marital status: Divorced    Spouse name: Not on file   Number of children: Not on file   Years of education: Not on file   Highest education level: Not on file  Occupational History   Not on file  Social Needs   Financial resource strain: Not on file   Food insecurity:    Worry: Not on file    Inability: Not on file   Transportation needs:    Medical: Not on file    Non-medical: Not on file  Tobacco Use   Smoking status: Former Smoker    Packs/day: 0.25    Years:  30.00    Pack years: 7.50    Types: Cigarettes    Last attempt to quit: 05/08/2018    Years since quitting: 0.3   Smokeless tobacco: Never Used  Substance and Sexual Activity   Alcohol use: Not Currently    Frequency: Never   Drug use: Not Currently    Types: "Crack" cocaine    Comment: sober x 67days, 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   Sexual activity: Not on file  Lifestyle   Physical activity:    Days per week: Not on file    Minutes per session: Not on file   Stress: Not on file  Relationships   Social connections:    Talks on phone: Not on file    Gets together: Not on file    Attends religious service: Not on file    Active member of club or organization: Not on file    Attends meetings of clubs or organizations: Not on file    Relationship status: Not on file  Other Topics Concern   Not on file  Social History Narrative   Not on file    His Allergies Are:  Allergies  Allergen Reactions   Cat Hair Extract Hives, Other (See Comments), Rash, Shortness Of Breath and Swelling   Shellfish Allergy Swelling  :   His Current Medications Are:  Outpatient Encounter Medications as of 09/07/2018  Medication Sig   amLODipine (NORVASC) 10 MG tablet Take 1 tablet (10 mg total) by mouth daily.   cetirizine (ZYRTEC) 10 MG tablet TK 1 T PO  D   cyclobenzaprine (FLEXERIL) 10 MG tablet Take 1 tablet (10 mg total) by mouth 3 (three) times daily as needed for muscle spasms.   Diclofenac Sodium (PENNSAID) 2 % SOLN Place 1 application onto the skin 2 (two) times daily.   fluticasone (FLONASE) 50 MCG/ACT nasal spray Place into both nostrils daily.   gabapentin (NEURONTIN) 300 MG capsule    ibuprofen (ADVIL) 800 MG tablet Take 1 tablet (800 mg total) by mouth every 8 (eight) hours as needed.   losartan-hydrochlorothiazide (HYZAAR) 100-25 MG tablet Take 1 tablet by mouth daily.   omeprazole (PRILOSEC) 20 MG capsule Take 1 capsule (20 mg total) by mouth 2 (two) times  daily before a meal.   predniSONE (DELTASONE) 20 MG tablet 3 tabs po x 2 days, 2 tabs po x 2 days, 1 tab po x 2 days, 1/2 tab po x 2 days   No facility-administered encounter medications on file as of 09/07/2018.   :   Review of Systems:  Out of a complete 14 point review of systems, all are reviewed and negative with the exception of these symptoms as listed below:  Observations/Objective: His neck circumference by self-report is between 18 and 19 inches, most  recent weight by self-report is 254 pounds.  On examination he is pleasant and conversant, in no acute distress, good comprehension and language skills, patient is oriented.  Speech is clear with no evidence of dysarthria, hypophonia or voice tremor, hearing is grossly intact, face is symmetric with normal facial animation, extraocular movements well preserved in all directions without nystagmus noted.  Airway examination reveals a somewhat moderately crowded airway, Mallampati appears to be class I, tonsils in place about 1-2+ in size, uvula normal in size wider tongue noted.  Tongue protrudes centrally in palate elevates symmetrically.  Shoulder height equal, neck mobility reveals normal findings.  Upper body mobility, muscle bulk and coordination grossly intact.  Assessment and Plan: Marcus Armstrong is a 53 year old right-handed gentleman with an underlying medical history of hypertension and obesity, who presents for a virtual, video base appointment via [email or web address] for evaluation of his sleep disorder, in particular, concern for underlying obstructive sleep apnea. The patient's medical history and physical exam (albeit limited with current video-based evaluation) are concerning for a diagnosis of obstructive sleep apnea. I discussed with the patient the diagnosis of OSA, its prognosis and treatment options. I explained in particular the risks and ramifications of untreated moderate to severe OSA, especially with respect to developing  cardiovascular disease down the Road, including congestive heart failure, difficult to treat hypertension, cardiac arrhythmias, or stroke. Even type 2 diabetes has, in part, been linked to untreated OSA. Symptoms of untreated OSA may include daytime sleepiness, memory problems, mood irritability and mood disorder such as depression and anxiety, lack of energy, as well as recurrent headaches, especially morning headaches. We talked about smoking cessation and ongoing abstinence from any illicit drugs, and the importance of weight control. We talked about the importance of maintaining good sleep hygiene. I recommended the following at this time: sleep study. I explained the sleep test procedure to the patient and also outlined possible treatment options of OSA, including the use of a custom-made dental device (which would require a referral to a specialist dentist), upper airway surgical options, (such as UPPP, which would involve a referral to an ENT). I also explained the CPAP vs. AutoPAP treatment option to the patient, who indicated that he would be willing to try CPAP if the need arises. He is advised to monitor his discolored sputum, he is strongly advised to discuss this with his primary care physician as well, he may benefit from evaluation with GI especially since he does take ibuprofen or meloxicam on a regular basis and could be at risk of esophagitis/gastritis or peptic ulcer. I answered all his questions today and the patient was in agreement. I plan to see the patient back after the sleep study is completed and encouraged him to call with any interim questions, concerns, problems or updates.   Star Age, MD, PhD    Follow Up Instructions:    I discussed the assessment and treatment plan with the patient. The patient was provided an opportunity to ask questions and all were answered. The patient agreed with the plan and demonstrated an understanding of the instructions.   The patient was  advised to call back or seek an in-person evaluation if the symptoms worsen or if the condition fails to improve as anticipated.  I provided 30 minutes of non-face-to-face time during this encounter.   Star Age, MD

## 2018-09-13 ENCOUNTER — Ambulatory Visit (INDEPENDENT_AMBULATORY_CARE_PROVIDER_SITE_OTHER): Payer: Managed Care, Other (non HMO) | Admitting: Nurse Practitioner

## 2018-09-13 ENCOUNTER — Encounter: Payer: Self-pay | Admitting: Nurse Practitioner

## 2018-09-13 ENCOUNTER — Ambulatory Visit: Payer: Managed Care, Other (non HMO) | Admitting: Physical Therapy

## 2018-09-13 DIAGNOSIS — R3915 Urgency of urination: Secondary | ICD-10-CM

## 2018-09-13 NOTE — Progress Notes (Signed)
Virtual Visit via Video Note  I connected with Marcus Armstrong on 09/13/18 at  1:00 PM EDT by a video enabled telemedicine application and verified that I am speaking with the correct person using two identifiers.  Location: Patient:Home Provider: Office   I discussed the limitations of evaluation and management by telemedicine and the availability of in person appointments. The patient expressed understanding and agreed to proceed.  CC: ongoing, gotten worse over the past couple of months.  History of Present Illness: Urinary Frequency   This is a chronic problem. The current episode started more than 1 year ago. The problem has been gradually worsening. The pain is at a severity of 0/10. The patient is experiencing no pain. There has been no fever. He is not sexually active. There is no history of pyelonephritis. Associated symptoms include frequency, hesitancy and urgency. Pertinent negatives include no chills, discharge, flank pain, hematuria or nausea. He has tried nothing for the symptoms. There is no history of catheterization, kidney stones, recurrent UTIs, urinary stasis or a urological procedure.   Observations/Objective: Physical Exam  Constitutional: He is oriented to person, place, and time. No distress.  Pulmonary/Chest: Effort normal.  Neurological: He is alert and oriented to person, place, and time.  Vitals reviewed.  Assessment and Plan: Marcus Armstrong was seen today for urinary urgency.  Diagnoses and all orders for this visit:  Urinary urgency -     Urinalysis w microscopic + reflex cultur; Future -     Urine cytology ancillary only([place]); Future -     Ambulatory referral to Urology   Follow Up Instructions: Normal urinalysis. Pending urine cytology. You will be contacted to schedule appt with urology.   I discussed the assessment and treatment plan with the patient. The patient was provided an opportunity to ask questions and all were answered. The patient  agreed with the plan and demonstrated an understanding of the instructions.   The patient was advised to call back or seek an in-person evaluation if the symptoms worsen or if the condition fails to improve as anticipated.  Wilfred Lacy, NP

## 2018-09-14 ENCOUNTER — Other Ambulatory Visit (INDEPENDENT_AMBULATORY_CARE_PROVIDER_SITE_OTHER): Payer: Managed Care, Other (non HMO)

## 2018-09-14 ENCOUNTER — Other Ambulatory Visit (HOSPITAL_COMMUNITY)
Admission: RE | Admit: 2018-09-14 | Discharge: 2018-09-14 | Disposition: A | Discharge status: Home / Self Care | Payer: Managed Care, Other (non HMO) | Source: Ambulatory Visit | Attending: Family Medicine | Admitting: Family Medicine

## 2018-09-14 DIAGNOSIS — R3915 Urgency of urination: Secondary | ICD-10-CM

## 2018-09-15 ENCOUNTER — Ambulatory Visit: Payer: Managed Care, Other (non HMO) | Attending: Family Medicine | Admitting: Physical Therapy

## 2018-09-15 ENCOUNTER — Encounter: Payer: Self-pay | Admitting: Family Medicine

## 2018-09-15 ENCOUNTER — Encounter: Payer: Self-pay | Admitting: Nurse Practitioner

## 2018-09-15 ENCOUNTER — Encounter: Payer: Self-pay | Admitting: Physical Therapy

## 2018-09-15 ENCOUNTER — Other Ambulatory Visit: Payer: Self-pay

## 2018-09-15 DIAGNOSIS — M5441 Lumbago with sciatica, right side: Secondary | ICD-10-CM | POA: Diagnosis present

## 2018-09-15 DIAGNOSIS — R262 Difficulty in walking, not elsewhere classified: Secondary | ICD-10-CM | POA: Diagnosis present

## 2018-09-15 DIAGNOSIS — G8929 Other chronic pain: Secondary | ICD-10-CM | POA: Insufficient documentation

## 2018-09-15 LAB — URINALYSIS W MICROSCOPIC + REFLEX CULTURE
Bacteria, UA: NONE SEEN /HPF
Bilirubin Urine: NEGATIVE
Glucose, UA: NEGATIVE
Hgb urine dipstick: NEGATIVE
Hyaline Cast: NONE SEEN /LPF
Ketones, ur: NEGATIVE
Leukocyte Esterase: NEGATIVE
Nitrites, Initial: NEGATIVE
Protein, ur: NEGATIVE
RBC / HPF: NONE SEEN /HPF (ref 0–2)
Specific Gravity, Urine: 1.02 (ref 1.001–1.03)
Squamous Epithelial / HPF: NONE SEEN /HPF (ref <=5)
WBC, UA: NONE SEEN /HPF (ref 0–5)
pH: 6.5 (ref 5.0–8.0)

## 2018-09-15 LAB — NO CULTURE INDICATED

## 2018-09-15 NOTE — Patient Instructions (Signed)
Access Code: [facility]  URL: [email or web address]  Date: 09/15/2018  Prepared by: Elsie Ra   Exercises  Supine Hamstring Stretch with Strap - 3 reps - 1 sets - 30 hold - 2x daily - 6x weekly  Supine March - 10 reps - 1-2 sets - 2x daily - 6x weekly  Seated Lumbar Flexion Stretch - 10-20 reps - 1 sets - 5 hold - 2x daily - 6x weekly  Supine Double Knee to Chest - 3 reps - 1 sets - 30 hold - 2x daily - 6x weekly

## 2018-09-15 NOTE — Therapy (Signed)
[place], [name], [address] Phone: [phone]   Fax:  [phone]  Physical Therapy Evaluation  Patient Details  Name: Marcus Armstrong MRN: [id number] Date of Birth: 06/25/1965 Referring Provider (PT): Ronnald Nian, DO   Encounter Date: 09/15/2018  PT End of Session - 09/15/18 1119    Visit Number  1    Number of Visits  12    Date for PT Re-Evaluation  10/27/18    Authorization Type  [employer]    PT Start Time  [phone]    PT Stop Time  0930    PT Time Calculation (min)  55 min    Equipment Utilized During Treatment  Gait belt    Activity Tolerance  Patient tolerated treatment well    Behavior During Therapy  [facility] for tasks assessed/performed       Past Medical History:  Diagnosis Date  . Hypertension     Past Surgical History:  Procedure Laterality Date  . polyp removal      There were no vitals filed for this visit.   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 This pain was flared up with long drive to [place]. He describes pain as spams and pulling. Pt states "I live in pain with my back". Pt notes a h/o root nerve tumors. No change in bowel or bladder function. No saddle anesthesia.  He is currently on short term disability due to the pain. He works as a Visual merchandiser.     Pertinent History  PMH: HTN,root nerve tumors    How long can you sit comfortably?  30 min    How long can you stand comfortably?  10-20 min    How long can you walk comfortably?  can not get all the way through supermarket without pain    Diagnostic tests  no recent    Patient Stated Goals  Try to get the back     Currently in Pain?  Yes    Pain Score  7     Pain Location  Back    Pain Orientation  Right;Left    Pain Descriptors /  Indicators  Spasm;Tightness    Pain Type  Chronic pain    Pain Radiating Towards  Rt leg and Lt foot    Pain Onset  More than a month ago    Pain Frequency  Constant    Aggravating Factors   prolonged standing or walking, can not mop which he has to do at his area at work    Pain Relieving Factors  pain meds, heat, ice    Effect of Pain on Daily Activities  limits work and housekeeping    Multiple Pain Sites  No         [facility] PT Assessment - 09/15/18 0001      Assessment   Medical Diagnosis  Acute on Chronic bilateral low back pain with Rtsciatica with back spams/strain.    Referring Provider (PT)  Ronnald Nian, DO    Onset Date/Surgical Date  --   chronic for many years but flare up 4 weeks ago   Next MD Visit  not scheduled    Prior Therapy  PT for same thing back in [facility]  Precautions   Precautions  None    Precaution Comments  some work restrictions as he is out on Short term disability      Restrictions   Weight Bearing Restrictions  No      Balance Screen   Has the patient fallen in the past 6 months  No      [place] residence    Additional Comments  one flight of stairs, pain with this      Prior Function   Level of Independence  Independent with basic ADLs    Vocation  --   on short term disability, does work as Product manager   Overall Cognitive Status  Within Functional Limits for tasks assessed      Observation/Other Assessments   Focus on Therapeutic Outcomes (FOTO)   68% limited      Sensation   Light Touch  Appears Intact      Coordination   Gross Motor Movements are Fluid and Coordinated  Yes      Posture/Postural Control   Posture Comments  fwd posture      ROM / Strength   AROM / PROM / Strength  AROM;Strength      AROM   AROM Assessment Site  Lumbar    Lumbar Flexion  50%    Lumbar Extension  <25%    Lumbar - Right Side Bend  50%    Lumbar - Left Side Bend  50%     Lumbar - Right Rotation  50%    Lumbar - Left Rotation  50%      Strength   Overall Strength Comments  LE strength WNL grossly in sitting except hip flexion and abduction 4+/5 bilat      Flexibility   Soft Tissue Assessment /Muscle Length  --   very tight lumbar P.S, and hamstrings     Palpation   Spinal mobility  hypomobile    Palpation comment  TTP lumbar P.S, spine, and glutes      Special Tests   Other special tests  +slump test on Rt, neg on LT, neg SLR bilat      Transfers   Transfers  Independent with all Transfers      Ambulation/Gait   Gait Comments  fwd flexed gait, shorted step length on Rt                 Objective measurements completed on examination: See above findings.      [facility] Adult PT Treatment/Exercise - 09/15/18 0001      Modalities   Modalities  Moist Heat;Electrical Stimulation      Moist Heat Therapy   Number Minutes Moist Heat  10 Minutes    Moist Heat Location  Lumbar Spine      Electrical Stimulation   Electrical Stimulation Location  lumbar    Electrical Stimulation Action  IFC    Electrical Stimulation Parameters  tolerance, pt sitting    Electrical Stimulation Goals  Pain             PT Education - 09/15/18 1119    Education Details  HEP, POC, TENS    Person(s) Educated  Patient    Methods  Explanation;Demonstration;Handout;Verbal cues    Comprehension  Verbalized understanding;Need further instruction          PT Long Term Goals - 09/15/18 1126      PT LONG TERM GOAL #  1   Title  Pt will be I and compliant with HEP. 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 He had preference for lumbar flexion today and pain was aggravated with extension. He has had success with traction in the past. He has overall decreased lumbar ROM, decresaed hip strenght, lumbar tightness and spasm, and decreased activity tolerance for standing and walking. He will benefit from skilled PT to address his deficits.     Personal Factors and Comorbidities  Comorbidity 1;Comorbidity 2;Time since onset of injury/illness/exacerbation    Comorbidities  PMH: HTN,root nerve tumors    Examination-Activity Limitations  Locomotion Level;Stand;Lift;Stairs;Squat    Examination-Participation Restrictions  Cleaning;Community Activity;Driving;Meal Prep    Stability/Clinical Decision Making  Evolving/Moderate complexity    Clinical Decision Making  Moderate    Rehab Potential  Good    PT Frequency  2x / week    PT Duration  6 weeks    PT Treatment/Interventions  Cryotherapy;Electrical Stimulation;Iontophoresis 4mg /ml Dexamethasone;Moist Heat;Ultrasound;Therapeutic activities;Neuromuscular re-education;Manual techniques;Passive range of motion;Dry needling;Taping;Spinal Manipulations;Joint Manipulations    PT Next Visit Plan  review HEP, appeared to favor lumbar flexion based exercises, try mechanical traction    PT Home Exercise Plan  KTC, HSS, sitting flexion stretch, supine marches    Consulted and Agree with Plan of Care  Patient       Patient will benefit from skilled therapeutic intervention in order to improve the following deficits and impairments:  Decreased activity tolerance, Decreased endurance, Decreased range of motion, Decreased strength, Hypomobility, Difficulty walking, Impaired flexibility, Increased muscle spasms, Pain, Postural dysfunction  Visit Diagnosis: Chronic bilateral low back pain with  right-sided sciatica  Difficulty in walking, not elsewhere classified     Problem List Patient Active Problem List   Diagnosis Date Noted  . It band syndrome, right 08/18/2018  . Personal history of colonic polyps 07/28/2018  . Essential hypertension 07/28/2018  . Gastroesophageal reflux disease 07/28/2018    Silvestre Mesi 09/15/2018, 11:29 AM  [facility] [address] [place], [name], [address] Phone: [phone]   Fax:  [phone]  Name: Marcus Armstrong MRN: [id number] Date of Birth: Jun 03, 1965

## 2018-09-16 LAB — URINE CYTOLOGY ANCILLARY ONLY
Chlamydia: NEGATIVE
Neisseria Gonorrhea: NEGATIVE
Trichomonas: NEGATIVE

## 2018-09-21 ENCOUNTER — Other Ambulatory Visit: Payer: Self-pay

## 2018-09-22 ENCOUNTER — Ambulatory Visit: Payer: Managed Care, Other (non HMO) | Admitting: Physical Therapy

## 2018-09-22 ENCOUNTER — Telehealth (INDEPENDENT_AMBULATORY_CARE_PROVIDER_SITE_OTHER): Payer: Managed Care, Other (non HMO) | Admitting: Family Medicine

## 2018-09-22 ENCOUNTER — Encounter: Payer: Self-pay | Admitting: Family Medicine

## 2018-09-22 ENCOUNTER — Encounter: Payer: Self-pay | Admitting: Physical Therapy

## 2018-09-22 DIAGNOSIS — M5441 Lumbago with sciatica, right side: Secondary | ICD-10-CM | POA: Diagnosis not present

## 2018-09-22 DIAGNOSIS — R3915 Urgency of urination: Secondary | ICD-10-CM

## 2018-09-22 DIAGNOSIS — G8929 Other chronic pain: Secondary | ICD-10-CM

## 2018-09-22 DIAGNOSIS — R262 Difficulty in walking, not elsewhere classified: Secondary | ICD-10-CM

## 2018-09-22 NOTE — Progress Notes (Signed)
Virtual Visit via Video Note  I connected with Marcus Armstrong on 09/22/18 at 11:00 AM EDT by a video enabled telemedicine application and verified that I am speaking with the correct person using two identifiers. Location patient: home Location provider: home office Persons participating in the virtual visit: patient, provider  I discussed the limitations of evaluation and management by telemedicine and the availability of in person appointments. The patient expressed understanding and agreed to proceed.  Chief Complaint  Patient presents with  . Follow-up    f/u about PT and documentation for insurance/ wants to discuss appt that had with Baldo Ash, still having urgency to urinate     HPI: Marcus Armstrong is a 53 y.o. male who wanted to f/u with me after 2 session of PT for his chronic back pain. He feels the PT is going well. He is currently on short term disability from work and states his case worker may be faxing a form(s) to me to complete with updated info regarding pts back, PT assessment, etc. I told him that as of Friday, which is when I was last in the office, I have not received anything. I will check when I return to the office tomorrow.  Pt also states he is still having urinary urgency as he was when he saw my colleague Wilfred Lacy on 09/13/18. His UA was negative. She had placed a referral to [facility] at that time as pt states this has been a chronic, intermittent issue x 6-7 years. He states he ahs not yet been contacted by urology to schedule an appt. Referral notes indicate it was faxed on 09/21/18.     Past Medical History:  Diagnosis Date  . Hypertension     Past Surgical History:  Procedure Laterality Date  . polyp removal      Family History  Problem Relation Age of Onset  . Cancer Father 15       prostate    Social History   Tobacco Use  . Smoking status: Current Some Day Smoker    Packs/day: 0.25    Years: 30.00    Pack years: 7.50    Types:  Cigarettes    Last attempt to quit: 05/08/2018    Years since quitting: 0.3  . Smokeless tobacco: Never Used  Substance Use Topics  . Alcohol use: Not Currently    Frequency: Never  . 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  amLODipine (NORVASC) 10 MG tablet, Take 1 tablet (10 mg total) by mouth daily., Disp: 90 tablet, Rfl: 3 .  cetirizine (ZYRTEC) 10 MG tablet, TK 1 T PO  D, Disp: , Rfl:  .  cyclobenzaprine (FLEXERIL) 10 MG tablet, Take 1 tablet (10 mg total) by mouth 3 (three) times daily as needed for muscle spasms., Disp: 60 tablet, Rfl: 0 .  Diclofenac Sodium (PENNSAID) 2 % SOLN, Place 1 application onto the skin 2 (two) times daily., Disp: 1 Bottle, Rfl: 3 .  fluticasone (FLONASE) 50 MCG/ACT nasal spray, Place into both nostrils daily., Disp: , Rfl:  .  gabapentin (NEURONTIN) 300 MG capsule, , Disp: , Rfl:  .  ibuprofen (ADVIL) 800 MG tablet, Take 1 tablet (800 mg total) by mouth every 8 (eight) hours as needed., Disp: 90 tablet, Rfl: 0 .  losartan-hydrochlorothiazide (HYZAAR) 100-25 MG tablet, Take 1 tablet by mouth daily., Disp: 90 tablet,  Rfl: 3 .  omeprazole (PRILOSEC) 20 MG capsule, Take 1 capsule (20 mg total) by mouth 2 (two) times daily before a meal., Disp: 180 capsule, Rfl: 0  Allergies  Allergen Reactions  . Cat Hair Extract Hives, Other (See Comments), Rash, Shortness Of Breath and Swelling  . Shellfish Allergy Swelling      ROS: See pertinent positives and negatives per HPI.   EXAM:  VITALS per patient if applicable: There were no vitals taken for this visit.   GENERAL: alert, oriented, appears well and in no acute distress  NECK: normal movements of the head and neck  LUNGS: on inspection no signs of respiratory distress, breathing rate appears normal, no obvious gross SOB, gasping or wheezing, no conversational dyspnea  CV: no obvious  cyanosis  MS: moves all visible extremities without noticeable abnormality  PSYCH/NEURO: pleasant and cooperative, speech and thought processing grossly intact   ASSESSMENT AND PLAN:  1. Urinary urgency - referral previously placed to [facility] - sent pt contact info for that office via [place] so he can reach out to schedule appt  2. Chronic right-sided low back pain with right-sided sciatica - continue with PT - will await fax from short term disability company and complete upon receipt of it    I discussed the assessment and treatment plan with the patient. The patient was provided an opportunity to ask questions and all were answered. The patient agreed with the plan and demonstrated an understanding of the instructions.   The patient was advised to call back or seek an in-person evaluation if the symptoms worsen or if the condition fails to improve as anticipated.   [name], DO
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
## 2018-09-23 ENCOUNTER — Telehealth (INDEPENDENT_AMBULATORY_CARE_PROVIDER_SITE_OTHER): Payer: Managed Care, Other (non HMO) | Admitting: Neurology

## 2018-09-23 ENCOUNTER — Other Ambulatory Visit: Payer: Self-pay

## 2018-09-23 DIAGNOSIS — R413 Other amnesia: Secondary | ICD-10-CM

## 2018-09-23 DIAGNOSIS — D3610 Benign neoplasm of peripheral nerves and autonomic nervous system, unspecified: Secondary | ICD-10-CM

## 2018-09-23 DIAGNOSIS — S83209A Unspecified tear of unspecified meniscus, current injury, unspecified knee, initial encounter: Secondary | ICD-10-CM | POA: Insufficient documentation

## 2018-09-23 NOTE — Addendum Note (Signed)
Addended by: Jake Seats on: 09/23/2018 12:50 PM   Modules accepted: Orders
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
## 2018-09-24 ENCOUNTER — Ambulatory Visit: Payer: Managed Care, Other (non HMO) | Admitting: Physical Therapy

## 2018-09-24 DIAGNOSIS — R262 Difficulty in walking, not elsewhere classified: Secondary | ICD-10-CM

## 2018-09-24 DIAGNOSIS — G8929 Other chronic pain: Secondary | ICD-10-CM

## 2018-09-24 DIAGNOSIS — M5441 Lumbago with sciatica, right side: Secondary | ICD-10-CM | POA: Diagnosis not present

## 2018-09-24 NOTE — Therapy (Signed)
[place], [name], [address] Phone: [phone]   Fax:  [phone]  Physical Therapy Treatment  Patient Details  Name: Marcus Armstrong MRN: [id number] Date of Birth: [birth year] Referring Provider (PT): Ronnald Nian, DO   Encounter Date: 09/24/2018  PT End of Session - 09/24/18 0900    Visit Number  3    Number of Visits  12    Date for PT Re-Evaluation  10/27/18    Authorization Type  cigna    PT Start Time  0800   first 10 min on heat   PT Stop Time  0905    PT Time Calculation (min)  65 min    Activity Tolerance  Patient tolerated treatment well    Behavior During Therapy  [facility] for tasks assessed/performed       Past Medical History:  Diagnosis Date  . Hypertension     Past Surgical History:  Procedure Laterality Date  . polyp removal      There were no vitals filed for this visit.  Subjective Assessment - 09/24/18 0826    Subjective  Pt reports he is tight today, did not get a chance to stretch this morning. He says mechanical traction from last time helped.    Currently in Pain?  Yes    Pain Score  4     Pain Location  Back    Pain Orientation  Right;Left    Pain Descriptors / Indicators  Aching;Tightness    Pain Type  Chronic pain                       [facility] Adult PT Treatment/Exercise - 09/24/18 0001      Lumbar Exercises: Stretches   Active Hamstring Stretch  Right;Left;2 reps;30 seconds    Single Knee to Chest Stretch  2 reps;30 seconds    Lower Trunk Rotation Limitations  10 reps for 3 sec each side    Pelvic Tilt  20 reps    Piriformis Stretch  Right;Left;2 reps;30 seconds    Other Lumbar Stretch Exercise  seated lumbar flexion stretch P ball roll out, 10 sec X 5    Other Lumbar Stretch Exercise  DKTC with TAC, with feet on P ball 5 sec X 15      Lumbar Exercises: Aerobic   Recumbent Bike  6 min L2      Lumbar Exercises: Supine   Bent Knee Raise   10 reps    Bent Knee Raise Limitations  tabletop hold with one leg lower at a time    Bridge  15 reps;5 seconds      Moist Heat Therapy   Number Minutes Moist Heat  10 Minutes    Moist Heat Location  Lumbar Spine   pre tx     Traction   Type of Traction  Lumbar    Min (lbs)  50    Max (lbs)  70    Hold Time  60    Rest Time  10    Time  10                  PT Long Term Goals - 09/15/18 1126      PT LONG TERM GOAL #1   Title  Pt will be I and compliant with HEP. (6 weeks 7/22 is target for all goals.)    Status  New      PT LONG TERM GOAL #2  Title  Pt will improve lumbar ROM to [facility] >60%     Status  New      PT LONG TERM GOAL #3   Title  Pt will improve FOTO to less than 50% limited to show improved function    Status  New      PT LONG TERM GOAL #4   Title  Pt will reduce pain by overall 50%    Status  New            Plan - 09/24/18 0916    Clinical Impression Statement  Session focused on flexion based stretching with addition of recumbant bike and core progression with good tolerance. Mechanical traction continued as he says this helped last time, traction increased slightly today with good tolerance. Continue POC.    PT Next Visit Plan  review HEP, appeared to favor lumbar flexion based exercises, try mechanical traction    PT Home Exercise Plan  KTC, HSS, sitting flexion stretch, supine marches       Patient will benefit from skilled therapeutic intervention in order to improve the following deficits and impairments:  Decreased activity tolerance, Decreased endurance, Decreased range of motion, Decreased strength, Hypomobility, Difficulty walking, Impaired flexibility, Increased muscle spasms, Pain, Postural dysfunction  Visit Diagnosis: 1. Chronic bilateral low back pain with right-sided sciatica   2. Difficulty in walking, not elsewhere classified        Problem List Patient Active Problem List   Diagnosis Date Noted  . Neurofibroma  09/23/2018  . Torn meniscus 09/23/2018  . It band syndrome, right 08/18/2018  . Personal history of colonic polyps 07/28/2018  . Essential hypertension 07/28/2018  . Gastroesophageal reflux disease 07/28/2018  . Pain in left knee 05/15/2015    Silvestre Mesi 09/24/2018, 9:22 AM  [facility] [address] [place], [name], [address] Phone: [phone]   Fax:  [phone]  Name: Travus Oren MRN: [id number] Date of Birth: 12/20/1965

## 2018-09-27 ENCOUNTER — Other Ambulatory Visit: Payer: Self-pay

## 2018-09-27 ENCOUNTER — Ambulatory Visit: Payer: Managed Care, Other (non HMO) | Admitting: Physical Therapy

## 2018-09-27 DIAGNOSIS — M5441 Lumbago with sciatica, right side: Secondary | ICD-10-CM | POA: Diagnosis not present

## 2018-09-27 DIAGNOSIS — R262 Difficulty in walking, not elsewhere classified: Secondary | ICD-10-CM

## 2018-09-27 DIAGNOSIS — G8929 Other chronic pain: Secondary | ICD-10-CM

## 2018-09-27 NOTE — Therapy (Signed)
[place], [name], [address] Phone: [phone]   Fax:  [phone]  Physical Therapy Treatment  Patient Details  Name: Adriana Lina MRN: [id number] Date of Birth: [birth year] Referring Provider (PT): Ronnald Nian, DO   Encounter Date: 09/27/2018  PT End of Session - 09/27/18 0905    Visit Number  4    Number of Visits  12    Date for PT Re-Evaluation  10/27/18    Authorization Type  cigna    PT Start Time  0830    PT Stop Time  0917    PT Time Calculation (min)  47 min    Activity Tolerance  Patient tolerated treatment well    Behavior During Therapy  [facility] for tasks assessed/performed       Past Medical History:  Diagnosis Date  . Hypertension     Past Surgical History:  Procedure Laterality Date  . polyp removal      There were no vitals filed for this visit.  Subjective Assessment - 09/27/18 0847    Subjective  Pt reports his back is improving and he is working hard on his HEP at home. He says mechanical traction from last time helped.    Pertinent History  PMH: HTN,root nerve tumors    How long can you sit comfortably?  30 min    How long can you stand comfortably?  10-20 min    How long can you walk comfortably?  can not get all the way through supermarket without pain    Diagnostic tests  no recent    Patient Stated Goals  Try to get the back     Currently in Pain?  Yes    Pain Score  2     Pain Location  Back    Pain Descriptors / Indicators  Aching;Tightness    Pain Type  Chronic pain    Pain Radiating Towards  denies    Pain Onset  More than a month ago                       [facility] Adult PT Treatment/Exercise - 09/27/18 0001      Lumbar Exercises: Stretches   Active Hamstring Stretch  Right;Left;2 reps;30 seconds    Single Knee to Chest Stretch  2 reps;30 seconds    Double Knee to Chest Stretch  2 reps;30 seconds    Piriformis Stretch  Right;Left;2  reps;30 seconds    Other Lumbar Stretch Exercise  DKTC with TAC, with feet on P ball 5 sec X 1o      Lumbar Exercises: Aerobic   Nustep  8 min L6 UE/LE      Lumbar Exercises: Supine   Bent Knee Raise  10 reps    Bent Knee Raise Limitations  tabletop hold with one leg lower at a time    Bridge  15 reps;5 seconds      Traction   Type of Traction  Lumbar    Min (lbs)  60    Max (lbs)  80    Hold Time  60    Rest Time  10    Time  10                  PT Long Term Goals - 09/15/18 1126      PT LONG TERM GOAL #1   Title  Pt will be I and compliant with HEP. (6 weeks 7/22  is target for all goals.)    Status  New      PT LONG TERM GOAL #2   Title  Pt will improve lumbar ROM to [facility] >60%     Status  New      PT LONG TERM GOAL #3   Title  Pt will improve FOTO to less than 50% limited to show improved function    Status  New      PT LONG TERM GOAL #4   Title  Pt will reduce pain by overall 50%    Status  New            Plan - 09/27/18 9379    Clinical Impression Statement  Pt is progressing well with his back ROM, core strengthening and pain levels. Mechanical traction increased a little more today at patient request as he feels this is helping with the pain. Continue POC    PT Next Visit Plan  review HEP, appeared to favor lumbar flexion based exercises, try mechanical traction    PT Home Exercise Plan  KTC, HSS, sitting flexion stretch, supine marches       Patient will benefit from skilled therapeutic intervention in order to improve the following deficits and impairments:  Decreased activity tolerance, Decreased endurance, Decreased range of motion, Decreased strength, Hypomobility, Difficulty walking, Impaired flexibility, Increased muscle spasms, Pain, Postural dysfunction  Visit Diagnosis: 1. Chronic bilateral low back pain with right-sided sciatica   2. Difficulty in walking, not elsewhere classified        Problem List Patient Active Problem  List   Diagnosis Date Noted  . 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 Torn meniscus 09/23/2018  . It band syndrome, right 08/18/2018  . Personal history of colonic polyps 07/28/2018  . Essential hypertension 07/28/2018  . Gastroesophageal reflux disease 07/28/2018  . Pain in left knee 05/15/2015    Silvestre Mesi 09/27/2018, 9:07 AM  [facility] [address] [place], [name], [address] Phone: [phone]   Fax:  [phone]  Name: Daevon Holdren MRN: [id number] Date of Birth: 01-28-1966

## 2018-09-29 ENCOUNTER — Other Ambulatory Visit: Payer: Self-pay

## 2018-09-29 ENCOUNTER — Ambulatory Visit: Payer: Managed Care, Other (non HMO) | Admitting: Physical Therapy

## 2018-09-29 DIAGNOSIS — M5441 Lumbago with sciatica, right side: Secondary | ICD-10-CM | POA: Diagnosis not present

## 2018-09-29 DIAGNOSIS — G8929 Other chronic pain: Secondary | ICD-10-CM

## 2018-09-29 DIAGNOSIS — R262 Difficulty in walking, not elsewhere classified: Secondary | ICD-10-CM

## 2018-09-29 NOTE — Therapy (Signed)
[place], [name], [address] Phone: [phone]   Fax:  [phone]  Physical Therapy Treatment  Patient Details  Name: Marcus Armstrong MRN: [id number] Date of Birth: 07/17/1965 Referring Provider (PT): Ronnald Nian, DO   Encounter Date: 09/29/2018  PT End of Session - 09/29/18 0907    Visit Number  5    Number of Visits  12    Date for PT Re-Evaluation  10/27/18    Authorization Type  cigna    PT Start Time  0830    PT Stop Time  0930    PT Time Calculation (min)  60 min    Activity Tolerance  Patient tolerated treatment well    Behavior During Therapy  [facility] for tasks assessed/performed       Past Medical History:  Diagnosis Date  . Hypertension     Past Surgical History:  Procedure Laterality Date  . polyp removal      There were no vitals filed for this visit.  Subjective Assessment - 09/29/18 0848    Subjective  Pt reports his back is improving but still has a little hitch in his back. He also says that he has a toothache    Pertinent History  PMH: HTN,root nerve tumors    How long can you sit comfortably?  30 min    How long can you stand comfortably?  10-20 min    How long can you walk comfortably?  can not get all the way through supermarket without pain    Diagnostic tests  no recent    Patient Stated Goals  Try to get the back     Currently in Pain?  Yes    Pain Score  2     Pain Location  Back    Pain Orientation  Lower;Mid    Pain Descriptors / Indicators  Aching    Pain Type  Chronic pain    Pain Onset  More than a month ago                       [facility] Adult PT Treatment/Exercise - 09/29/18 0001      Lumbar Exercises: Stretches   Single Knee to Chest Stretch  2 reps;30 seconds    Piriformis Stretch  Right;Left;2 reps;30 seconds      Lumbar Exercises: Aerobic   Nustep  12 min L7 UE/LE      Lumbar Exercises: Machines for Strengthening   Cybex Knee  Extension  15 lbs on Rt, 10 lbs on Lt X 20 reps    Cybex Knee Flexion  45 lbs X 20 reps bilat    Leg Press  65 lbs 2X 10    Other Lumbar Machine Exercise  row machine 2 sets of 10 each grip at 35 lbs    Other Lumbar Machine Exercise  lat pull and chest press maching 35 lbs 2X10      Traction   Type of Traction  Lumbar    Min (lbs)  65    Max (lbs)  85    Hold Time  60    Rest Time  10    Time  15                  PT Long Term Goals - 09/15/18 1126      PT LONG TERM GOAL #1   Title  Pt will be I and compliant with HEP. (6 weeks  7/22 is target for all goals.)    Status  New      PT LONG TERM GOAL #2   Title  Pt will improve lumbar ROM to [facility] >60%     Status  New      PT LONG TERM GOAL #3   Title  Pt will improve FOTO to less than 50% limited to show improved function    Status  New      PT LONG TERM GOAL #4   Title  Pt will reduce pain by overall 50%    Status  New            Plan - 09/29/18 0907    Clinical Impression Statement  Pt again able to progress his strengthening program and weight machines were added today for general strengthening and conditioning with good tolerance and without complaints. Traction again performed today with increased pull for pain relief and spinal decompression.    PT Next Visit Plan  appears to favor lumbar flexion based exercises, try mechanical traction, progress general strengthening and core as tolerated    PT Home Exercise Plan  KTC, HSS, sitting flexion stretch, supine marches    Consulted and Agree with Plan of Care  Patient       Patient will benefit from skilled therapeutic intervention in order to improve the following deficits and impairments:  Decreased activity tolerance, Decreased endurance, Decreased range of motion, Decreased strength, Hypomobility, Difficulty walking, Impaired flexibility, Increased muscle spasms, Pain, Postural dysfunction  Visit Diagnosis: 1. Chronic bilateral low back pain with  right-sided sciatica   2. Difficulty in walking, not elsewhere classified        Problem List Patient Active Problem List   Diagnosis Date Noted  . 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 Torn meniscus 09/23/2018  . It band syndrome, right 08/18/2018  . Personal history of colonic polyps 07/28/2018  . Essential hypertension 07/28/2018  . Gastroesophageal reflux disease 07/28/2018  . Pain in left knee 05/15/2015    Silvestre Mesi 09/29/2018, 9:11 AM  [facility] [address] [place], [name], [address] Phone: [phone]   Fax:  [phone]  Name: Marcus Armstrong MRN: [id number] Date of Birth: 01-31-1966

## 2018-09-30 ENCOUNTER — Encounter: Payer: Self-pay | Admitting: Family Medicine

## 2018-09-30 ENCOUNTER — Ambulatory Visit: Payer: Self-pay | Admitting: Neurology

## 2018-10-04 ENCOUNTER — Other Ambulatory Visit: Payer: Self-pay

## 2018-10-04 ENCOUNTER — Ambulatory Visit: Payer: Managed Care, Other (non HMO) | Admitting: Physical Therapy

## 2018-10-04 DIAGNOSIS — M5441 Lumbago with sciatica, right side: Secondary | ICD-10-CM | POA: Diagnosis not present

## 2018-10-04 DIAGNOSIS — G8929 Other chronic pain: Secondary | ICD-10-CM

## 2018-10-04 DIAGNOSIS — R262 Difficulty in walking, not elsewhere classified: Secondary | ICD-10-CM

## 2018-10-04 NOTE — Therapy (Signed)
[name], [name], [address] Phone: [phone]   Fax:  [phone]  Physical Therapy Treatment  Patient Details  Name: Marcus Armstrong MRN: [id number] Date of Birth: 08-30-1965 Referring Provider (PT): Ronnald Nian, DO   Encounter Date: 10/04/2018  PT End of Session - 10/04/18 1517    Visit Number  6    Number of Visits  12    Date for PT Re-Evaluation  10/27/18    Authorization Type  cigna    PT Start Time  0830    PT Stop Time  0930    PT Time Calculation (min)  60 min    Activity Tolerance  Patient tolerated treatment well    Behavior During Therapy  [facility] for tasks assessed/performed       Past Medical History:  Diagnosis Date  . Hypertension     Past Surgical History:  Procedure Laterality Date  . polyp removal      There were no vitals filed for this visit.  Subjective Assessment - 10/04/18 0840    Subjective  He relays  a "gimp" in his low back today.    Pain Score  4     Pain Location  Back    Pain Orientation  Lower    Pain Descriptors / Indicators  Aching    Pain Type  Chronic pain    Pain Radiating Towards  denies         [facility] PT Assessment - 10/04/18 0001      AROM   Lumbar Flexion  75%    Lumbar Extension  50%    Lumbar - Right Side Bend  50%    Lumbar - Left Side Bend  50%    Lumbar - Right Rotation  75%    Lumbar - Left Rotation  75%                   [facility] Adult PT Treatment/Exercise - 10/04/18 0001      Lumbar Exercises: Stretches   Single Knee to Chest Stretch  2 reps;30 seconds    Piriformis Stretch  Right;Left;2 reps;30 seconds    Other Lumbar Stretch Exercise  child pose 30 sec  X2      Lumbar Exercises: Aerobic   Nustep  10 min L7 UE/LE with heat      Lumbar Exercises: Machines for Strengthening   Cybex Knee Extension  20 lbs X 20 reps bilat    Cybex Knee Flexion  45 lbs X 20 reps bilat    Leg Press  75 lbs 2X10    Other Lumbar  Machine Exercise  row machine 2 sets total one set of 10 each grip at 45 lbs    Other Lumbar Machine Exercise  lat pull and chest press maching 45 lbs 20      Traction   Type of Traction  Lumbar    Min (lbs)  70    Max (lbs)  90    Hold Time  60    Rest Time  10    Time  15                  PT Long Term Goals - 10/04/18 0924      PT LONG TERM GOAL #1   Title  Pt will be I and compliant with HEP. (6 weeks 7/22 is target for all goals.)    Status  On-going      PT LONG  TERM GOAL #2   Title  Pt will improve lumbar ROM to [facility] >60%     Status  On-going      PT LONG TERM GOAL #3   Title  Pt will improve FOTO to less than 50% limited to show improved function    Status  On-going      PT LONG TERM GOAL #4   Title  Pt will reduce pain by overall 50%    Status  On-going            Plan - 10/04/18 3267    Clinical Impression Statement  He had a little more back pain and tighntess today so more stretching performed in beginning of session after warmup. He then was able to show improved back ROM from his eval. Strength program again progressed with good tolerance. Traction continued as he reports this is still helping with his pain. His function is improving. Continue POC    PT Next Visit Plan  appears to favor lumbar flexion based exercises, try mechanical traction, progress general strengthening and core as tolerated    PT Home Exercise Plan  KTC, HSS, sitting flexion stretch, supine marches    Consulted and Agree with Plan of Care  Patient       Patient will benefit from skilled therapeutic intervention in order to improve the following deficits and impairments:  Decreased activity tolerance, Decreased endurance, Decreased range of motion, Decreased strength, Hypomobility, Difficulty walking, Impaired flexibility, Increased muscle spasms, Pain, Postural dysfunction  Visit Diagnosis: 1. Chronic bilateral low back pain with right-sided sciatica   2. Difficulty in  walking, not elsewhere classified        Problem List Patient Active Problem List   Diagnosis Date Noted  . 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 Torn meniscus 09/23/2018  . It band syndrome, right 08/18/2018  . Personal history of colonic polyps 07/28/2018  . Essential hypertension 07/28/2018  . Gastroesophageal reflux disease 07/28/2018  . Pain in left knee 05/15/2015    Silvestre Mesi 10/04/2018, 9:25 AM  [facility] [address] [place], [name], [address] Phone: [phone]   Fax:  [phone]  Name: Marcus Armstrong MRN: [id number] Date of Birth: 1965-11-18

## 2018-10-06 ENCOUNTER — Encounter: Payer: Self-pay | Admitting: Physical Therapy

## 2018-10-06 ENCOUNTER — Ambulatory Visit (INDEPENDENT_AMBULATORY_CARE_PROVIDER_SITE_OTHER): Payer: Managed Care, Other (non HMO) | Admitting: Neurology

## 2018-10-06 ENCOUNTER — Ambulatory Visit: Payer: Managed Care, Other (non HMO) | Admitting: Physical Therapy

## 2018-10-06 ENCOUNTER — Other Ambulatory Visit: Payer: Self-pay

## 2018-10-06 ENCOUNTER — Ambulatory Visit: Payer: Managed Care, Other (non HMO) | Attending: Family Medicine | Admitting: Physical Therapy

## 2018-10-06 DIAGNOSIS — R262 Difficulty in walking, not elsewhere classified: Secondary | ICD-10-CM | POA: Diagnosis present

## 2018-10-06 DIAGNOSIS — M5441 Lumbago with sciatica, right side: Secondary | ICD-10-CM | POA: Insufficient documentation

## 2018-10-06 DIAGNOSIS — E669 Obesity, unspecified: Secondary | ICD-10-CM

## 2018-10-06 DIAGNOSIS — R0689 Other abnormalities of breathing: Secondary | ICD-10-CM

## 2018-10-06 DIAGNOSIS — R351 Nocturia: Secondary | ICD-10-CM

## 2018-10-06 DIAGNOSIS — G8929 Other chronic pain: Secondary | ICD-10-CM | POA: Diagnosis present

## 2018-10-06 DIAGNOSIS — G4733 Obstructive sleep apnea (adult) (pediatric): Secondary | ICD-10-CM

## 2018-10-06 DIAGNOSIS — G4719 Other hypersomnia: Secondary | ICD-10-CM

## 2018-10-06 DIAGNOSIS — R0683 Snoring: Secondary | ICD-10-CM

## 2018-10-06 NOTE — Therapy (Signed)
Marcus Armstrong, [name], [address] Phone: [phone]   Fax:  [phone]  Physical Therapy Treatment  Patient Details  Name: Marcus Armstrong MRN: [id number] Date of Birth: 1965/06/24 Referring Provider (PT): Marcus Nian, DO   Encounter Date: 10/06/2018  PT End of Session - 10/06/18 1419    Visit Number  7    Number of Visits  12    Date for PT Re-Evaluation  10/27/18    Authorization Type  cigna    PT Start Time  1330    PT Stop Time  1415    PT Time Calculation (min)  45 min    Activity Tolerance  Patient tolerated treatment well    Behavior During Therapy  [facility] for tasks assessed/performed       Past Medical History:  Diagnosis Date  . Hypertension     Past Surgical History:  Procedure Laterality Date  . polyp removal      There were no vitals filed for this visit.  Subjective Assessment - 10/06/18 1331    Subjective  I am having spasms but I have been on my feet longer today.    How long can you stand comfortably?  1 hour    Patient Stated Goals  Try to get the back                        [facility] Adult PT Treatment/Exercise - 10/06/18 0001      Lumbar Exercises: Aerobic   Nustep  7 min L7 UE & LE      Lumbar Exercises: Standing   Other Standing Lumbar Exercises  trunk D1, D2 yellow tband    Other Standing Lumbar Exercises  shoulder flexion 5lb- cues for core      Traction   Min (lbs)  75    Max (lbs)  95    Hold Time  60    Rest Time  20    Time  15                  PT Long Term Goals - 10/04/18 7169      PT LONG TERM GOAL #1   Title  Pt will be I and compliant with HEP. (6 weeks 7/22 is target for all goals.)    Status  On-going      PT LONG TERM GOAL #2   Title  Pt will improve lumbar ROM to [facility] >60%     Status  On-going      PT LONG TERM GOAL #3   Title  Pt will improve FOTO to less than 50% limited to show improved function    Status   On-going      PT LONG TERM GOAL #4   Title  Pt will reduce pain by overall 50%    Status  On-going            Plan - 10/06/18 1458    Clinical Impression Statement  Progressed traction to 95 lb today which pt tolerated well and resolved spasm sensation. Anchored resistance to trunk rotations for core engagement.    PT Treatment/Interventions  Cryotherapy;Electrical Stimulation;Iontophoresis 4mg /ml Dexamethasone;Moist Heat;Ultrasound;Therapeutic activities;Neuromuscular re-education;Manual techniques;Passive range of motion;Dry needling;Taping;Spinal Manipulations;Joint Manipulations    PT Next Visit Plan  continue core activation, traction if appropriate    PT Home Exercise Plan  KTC, HSS, sitting flexion stretch, supine marches, OH reach 5lb, chops    Consulted and Agree  with Plan of Care  Patient       Patient will benefit from skilled therapeutic intervention in order to improve the following deficits and impairments:  Decreased activity tolerance, Decreased endurance, Decreased range of motion, Decreased strength, Hypomobility, Difficulty walking, Impaired flexibility, Increased muscle spasms, Pain, Postural dysfunction  Visit Diagnosis: 1. Chronic bilateral low back pain with right-sided sciatica   2. Difficulty in walking, not elsewhere classified        Problem List Patient Active Problem List   Diagnosis Date Noted  . 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 Torn meniscus 09/23/2018  . It band syndrome, right 08/18/2018  . Personal history of colonic polyps 07/28/2018  . Essential hypertension 07/28/2018  . Gastroesophageal reflux disease 07/28/2018  . Pain in left knee 05/15/2015   Marcus Armstrong C. Yarely Bebee PT, DPT 10/06/18 3:14 PM   [facility] [address] [place], [name], [address] Phone: [phone]   Fax:  [phone]  Name: Marcus Armstrong MRN: [id number] Date of Birth: 03-08-1966

## 2018-10-13 ENCOUNTER — Other Ambulatory Visit: Payer: Self-pay

## 2018-10-13 ENCOUNTER — Ambulatory Visit: Payer: Managed Care, Other (non HMO) | Admitting: Physical Therapy

## 2018-10-13 DIAGNOSIS — M5441 Lumbago with sciatica, right side: Secondary | ICD-10-CM | POA: Diagnosis not present

## 2018-10-13 DIAGNOSIS — R262 Difficulty in walking, not elsewhere classified: Secondary | ICD-10-CM

## 2018-10-13 DIAGNOSIS — G8929 Other chronic pain: Secondary | ICD-10-CM

## 2018-10-13 NOTE — Therapy (Signed)
[place], [name], [address] Phone: [phone]   Fax:  [phone]  Physical Therapy Treatment  Patient Details  Name: Marcus Armstrong MRN: [id number] Date of Birth: 1966/01/12 Referring Provider (PT): Ronnald Nian, DO   Encounter Date: 10/13/2018  PT End of Session - 10/13/18 0900    Visit Number  8    Number of Visits  12    Date for PT Re-Evaluation  10/27/18    Authorization Type  cigna    PT Start Time  0830    PT Stop Time  0930    PT Time Calculation (min)  60 min    Activity Tolerance  Patient tolerated treatment well    Behavior During Therapy  [facility] for tasks assessed/performed       Past Medical History:  Diagnosis Date  . Hypertension     Past Surgical History:  Procedure Laterality Date  . polyp removal      There were no vitals filed for this visit.  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    Pertinent History  PMH: HTN,root nerve tumors    How long can you sit comfortably?  30 min    How long can you stand comfortably?  1 hour    How long can you walk comfortably?  can not get all the way through supermarket without pain    Diagnostic tests  no recent    Patient Stated Goals  Try to get the back     Currently in Pain?  Yes    Pain Score  8     Pain Location  Back    Pain Orientation  Right;Left;Lower    Pain Descriptors / Indicators  Aching;Tightness    Pain Type  Chronic pain    Pain Onset  More than a month ago                       [facility] Adult PT Treatment/Exercise - 10/13/18 0001      Lumbar Exercises: Stretches   Piriformis Stretch  Right;Left;2 reps;30 seconds      Lumbar Exercises: Aerobic   Nustep  7 min L7 UE & LE      Lumbar Exercises: Standing   Row  Strengthening;20 reps    Theraband Level (Row)  Level 2 (Red)    Other  Standing Lumbar Exercises  trunk D1, D2 yellow tband    Other Standing Lumbar Exercises  anti rotation pilof press X 10 yellow      Electrical Stimulation   Electrical Stimulation Location  lumbar    Electrical Stimulation Action  IFC    Electrical Stimulation Parameters  to tolerance    Electrical Stimulation Goals  Pain      Traction   Min (lbs)  80    Max (lbs)  100    Hold Time  60    Rest Time  20    Time  15                  PT Long Term Goals - 10/04/18 0924      PT LONG TERM GOAL #1   Title  Pt will be I and compliant with HEP. (6 weeks 7/22 is target for all goals.)    Status  On-going  PT LONG TERM GOAL #2   Title  Pt will improve lumbar ROM to [facility] >60%     Status  On-going      PT LONG TERM GOAL #3   Title  Pt will improve FOTO to less than 50% limited to show improved function    Status  On-going      PT LONG TERM GOAL #4   Title  Pt will reduce pain by overall 50%    Status  On-going            Plan - 10/13/18 0901    Clinical Impression Statement  Traction and TENS performed for pain relief. He was then able to show good tolerance with exercise program with more core focus. Continue POC    PT Treatment/Interventions  Cryotherapy;Electrical Stimulation;Iontophoresis 4mg /ml Dexamethasone;Moist Heat;Ultrasound;Therapeutic activities;Neuromuscular re-education;Manual techniques;Passive range of motion;Dry needling;Taping;Spinal Manipulations;Joint Manipulations    PT Next Visit Plan  continue core activation, traction if appropriate    PT Home Exercise Plan  KTC, HSS, sitting flexion stretch, supine marches, OH reach 5lb, chops    Consulted and Agree with Plan of Care  Patient       Patient will benefit from skilled therapeutic intervention in order to improve the following deficits and impairments:  Decreased activity tolerance, Decreased endurance, Decreased range of motion, Decreased strength, Hypomobility, Difficulty walking, Impaired  flexibility, Increased muscle spasms, Pain, Postural dysfunction  Visit Diagnosis: 1. Chronic bilateral low back pain with right-sided sciatica   2. Difficulty in walking, not elsewhere classified        Problem List Patient Active Problem List   Diagnosis Date Noted  . 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 Torn meniscus 09/23/2018  . It band syndrome, right 08/18/2018  . Personal history of colonic polyps 07/28/2018  . Essential hypertension 07/28/2018  . Gastroesophageal reflux disease 07/28/2018  . Pain in left knee 05/15/2015    Silvestre Mesi 10/13/2018, 9:03 AM  [facility] [address] [place], [name], [address] Phone: [phone]   Fax:  [phone]  Name: Kojo Liby MRN: [id number] Date of Birth: 05-Jan-1966

## 2018-10-14 ENCOUNTER — Telehealth: Payer: Self-pay

## 2018-10-14 NOTE — Telephone Encounter (Signed)
I called pt. I advised pt that Dr. Rexene Alberts reviewed their sleep study results and found that pt has moderate osa. Dr. Rexene Alberts recommends that pt start an auto pap at home. I reviewed PAP compliance expectations with the pt. Pt is agreeable to starting an auto-PAP. I advised pt that an order will be sent to a DME, [employer], and Huey Romans will call the pt within about one week after they file with the pt's insurance. Huey Romans will show the pt how to use the machine, fit for masks, and troubleshoot the auto-PAP if needed. A follow up appt was made for insurance purposes with Amy, NP on 12/21/18 at 3:30pm. Pt verbalized understanding to arrive 15 minutes early and bring their auto-PAP. A letter with all of this information in it will be mailed to the pt as a reminder. I verified with the pt that the address we have on file is correct. Pt verbalized understanding of results. Pt had no questions at this time but was encouraged to call back if questions arise. I have sent the order to [facility] and have received confirmation that they have received the order.

## 2018-10-14 NOTE — Progress Notes (Signed)
Patient referred by Wilfred Lacy, NP, seen virtually by me on 09/07/18, HST on 10/06/18.    Please call and notify the patient that the recent home sleep test showed obstructive sleep apnea in the moderate range. While I recommend treatment for this in the form CPAP, we are not yet bringing patients in for in-lab testing for CPAP titration studies, due to the virus pandemic; therefore, I suggest we start him on autoPAP at home, which means, that we don't have to bring him in for a sleep study with CPAP, but will let him start using an autoPAP machine at home, through a DME company (of patient's choice, or as per insurance requirement, as per in [employer], if there are such restrictions, depending on insurance carrier). The DME representative will educate the patient on how to use the machine, how to put the mask on, etc. I have placed an order in the chart. Please send referral, talk to patient, send report to referring MD. We will need a FU in sleep clinic for 10 weeks post-PAP set up, please arrange that with me or one of our NPs.  Also, please remind patient about the importance of compliance with PAP usage, this is an Designer, industrial/product, but good compliance also helps [place] track improvements in patient's sleep related complaints and objective improvements, such as BP and weight for example or nocturia or headaches, etc. For concerns and questions about how to clean the PAP machine and the supplies and how frequently to change the hose, mask and filters, etc., patient can call the DME company, for more information, education and troubleshooting. Especially in the current situation, I recommend, patients be extra mindful about hand hygiene, handling the PAP equipment only with clean hands, wipe the mask daily, keep little one and four-legged companions (and any other pets for that matter) away from the machine and mask at all times.    Thanks,   Star Age, MD, PhD [facility]  [facility])

## 2018-10-14 NOTE — Procedures (Signed)
Patient Information     First Name: Dowell Last Name: Chisolm ID: [id number]  Birth Date: [birth year] Age: 53 Gender: Male  Referring Provider: Wilfred Lacy, NP BMI: 34.3 (W=253 lb, H=6' 0'')  Neck Circ.:  14 '' Epworth:  14/24   Sleep Study Information    Study Date: Oct 06, 2018 S/H/A Version: 001.001.001.001 / 4.1.1528 / 87  History:     53 year old man with a history of hypertension and obesity, who reports snoring and excessive daytime sleepiness.    Summary & Diagnosis:       OSA  Recommendations:     This home sleep test demonstrates moderate obstructive sleep apnea with a total AHI of 29.3/hour and O2 nadir of 88%. Treatment with positive airway pressure (PAP) - in the form of CPAP - is recommended. This will require, ideally, a full night CPAP titration study for proper treatment settings, O2 monitoring and mask fitting. Based on the severity of the sleep disordered breathing, an attended titration study is indicated. However, under the current circumstances (i.e. the COVID-19 pandemic), in order to ensure continuity of care and for the safety of the patient and healthcare professionals, he will be advised to proceed with an autoPAP titration/trial at home. A proper overnight, lab-attended PAP titration study with CPAP may be helpful or needed down the road to optimize treatment, when considered safe. Please note, that untreated obstructive sleep apnea may carry additional perioperative morbidity. Patients with significant obstructive sleep apnea should receive perioperative PAP therapy and the surgeons and particularly the anesthesiologist should be informed of the diagnosis and the severity of the sleep disordered breathing. Patient will be reminded regarding compliance with the PAP machine and to be mindful of cleanliness with the equipment and timely with supply changes (i.e. changing filter, mask, hose, humidifier chamber on an ongoing basis, as recommended, and cleaning parts that touch the  face and nose daily, etc). The patient should be cautioned not to drive, work at heights, or operate dangerous or heavy equipment when tired or sleepy. Review and reiteration of good sleep hygiene measures should be pursued with any patient. Other causes of the patient's symptoms, including circadian rhythm disturbances, an underlying mood disorder, medication effect and/or an underlying medical problem cannot be ruled out based on this test. Clinical correlation is recommended. The patient and his referring provider will be notified of the test results. The patient will be seen in follow up in sleep clinic at [facility], either for a face-to-face or virtual visit, whichever feasible and recommended at the time.  I certify that I have reviewed the raw data recording prior to the issuance of this report in accordance with the standards of the American Academy of Sleep Medicine (AASM).  Star Age, MD, PhD [facility]) Diplomat, ABPN (Neurology and Sleep)          Sleep Summary  Oxygen Saturation Statistics   Start Study Time: End Study Time: Total Recording Time:    10:33:12 PM 6:44:29 AM      8 h, 11 min  Total Sleep Time % REM of Sleep Time:  4 h, 10 min  3.6    Mean: 95 Minimum: 88 Maximum: 99  Mean of Desaturations Nadirs (%):   92  Oxygen Desaturation. %: 4-9 10-20 >20 Total  Events Number Total  52 100.0  0 0.0  0 0.0  52 100.0  Oxygen Saturation: <90 <=88 <85 <80 <70  Duration (minutes): Sleep % 1.1 0.2 0.4 0.1 0.0  0.0 0.0 0.0 0.0 0.0     Respiratory Indices      Total Events REM NREM All Night  pRDI:  132  pAHI:  120 ODI:  52  pAHIc:  4  % CSR: 0.0 N/A N/A N/A N/A N/A N/A N/A N/A 32.2 29.3 12.7 1.0       Pulse Rate Statistics during Sleep (BPM)      Mean: 74 Minimum: 49 Maximum: 94    Indices are calculated using technically valid sleep time of  4 hrs, 5 min. Central-Indices are calculated using technically valid sleep  time of  3  hrs, 52 min. pRDI/pAHI are calculated using oxi desaturations ? 3% REM/NREM indices appear only if REM time >  30 min.  Body Position Statistics  Position Supine Prone Right Left Non-Supine  Sleep (min) 76.8 0.0 66.5 107.5 174.0  Sleep % 30.6 0.0 26.5 42.9 69.4  pRDI 32.6 N/A 40.3 26.9 32.1  pAHI 28.6 N/A 37.6 24.6 29.6  ODI 11.9 N/A 21.1 8.0 13.0     Snoring Statistics Snoring Level (dB) >40 >50 >60 >70 >80 >Threshold (45)  Sleep (min) 199.4 62.2 16.1 0.0 0.0 112.3  Sleep % 79.5 24.8 6.4 0.0 0.0 44.8    Mean: 47 dB Sleep Stages Chart                                             pAHI=29.3                                                          Mild              Moderate                    Severe                                                    5              15                    30

## 2018-10-14 NOTE — Telephone Encounter (Signed)
-----   Message from Star Age, MD sent at 10/14/2018  8:15 AM EDT ----- Patient referred by Wilfred Lacy, NP, seen virtually by me on 09/07/18, HST on 10/06/18.    Please call and notify the patient that the recent home sleep test showed obstructive sleep apnea in the moderate range. While I recommend treatment for this in the form CPAP, we are not yet bringing patients in for in-lab testing for CPAP titration studies, due to the virus pandemic; therefore, I suggest we start him on autoPAP at home, which means, that we don't have to bring him in for a sleep study with CPAP, but will let him start using an autoPAP machine at home, through a DME company (of patient's choice, or as per insurance requirement, as per in [employer], if there are such restrictions, depending on insurance carrier). The DME representative will educate the patient on how to use the machine, how to put the mask on, etc. I have placed an order in the chart. Please send referral, talk to patient, send report to referring MD. We will need a FU in sleep clinic for 10 weeks post-PAP set up, please arrange that with me or one of our NPs.  Also, please remind patient about the importance of compliance with PAP usage, this is an Designer, industrial/product, but good compliance also helps [place] track improvements in patient's sleep related complaints and objective improvements, such as BP and weight for example or nocturia or headaches, etc. For concerns and questions about how to clean the PAP machine and the supplies and how frequently to change the hose, mask and filters, etc., patient can call the DME company, for more information, education and troubleshooting. Especially in the current situation, I recommend, patients be extra mindful about hand hygiene, handling the PAP equipment only with clean hands, wipe the mask daily, keep little one and four-legged companions (and any other pets for that matter) away from the machine and mask at all times.     Thanks,   Star Age, MD, PhD [facility])

## 2018-10-14 NOTE — Addendum Note (Signed)
Addended by: Star Age on: 10/14/2018 08:15 AM   Modules accepted: Orders

## 2018-10-15 ENCOUNTER — Ambulatory Visit: Payer: Managed Care, Other (non HMO) | Admitting: Physical Therapy

## 2018-10-15 ENCOUNTER — Other Ambulatory Visit: Payer: Self-pay

## 2018-10-15 DIAGNOSIS — M5441 Lumbago with sciatica, right side: Secondary | ICD-10-CM | POA: Diagnosis not present

## 2018-10-15 DIAGNOSIS — G8929 Other chronic pain: Secondary | ICD-10-CM

## 2018-10-15 DIAGNOSIS — R262 Difficulty in walking, not elsewhere classified: Secondary | ICD-10-CM

## 2018-10-15 NOTE — Therapy (Signed)
Marcus Armstrong, [name], [address] Phone: [phone]   Fax:  [phone]  Physical Therapy Treatment  Patient Details  Name: Marcus Armstrong MRN: [id number] Date of Birth: 1965/04/14 Referring Provider (PT): Ronnald Nian, DO   Encounter Date: 10/15/2018  PT End of Session - 10/15/18 0838    Visit Number  9    Number of Visits  12    Date for PT Re-Evaluation  10/27/18    Authorization Type  cigna    PT Start Time  0820   20 min late start due to him being on phone   PT Stop Time  0850    PT Time Calculation (min)  30 min    Activity Tolerance  Patient tolerated treatment well    Behavior During Therapy  [facility] for tasks assessed/performed       Past Medical History:  Diagnosis Date  . Hypertension     Past Surgical History:  Procedure Laterality Date  . polyp removal      There were no vitals filed for this visit.  Subjective Assessment - 10/15/18 0836    Subjective  Pt showed up to appointment on time but had to take a phone call and did not get started until 20 min later. He relays he is frustrated and angry due to "fraud on his credit card". Relays he has 3/10 LBP today.                       [facility] Adult PT Treatment/Exercise - 10/15/18 0001      Lumbar Exercises: Stretches   Single Knee to Chest Stretch  2 reps;30 seconds      Lumbar Exercises: Aerobic   Nustep  5 min L7 UE/LE      Traction   Min (lbs)  80    Max (lbs)  100    Hold Time  60    Rest Time  20    Time  15                  PT Long Term Goals - 10/04/18 5993      PT LONG TERM GOAL #1   Title  Pt will be I and compliant with HEP. (6 weeks 7/22 is target for all goals.)    Status  On-going      PT LONG TERM GOAL #2   Title  Pt will improve lumbar ROM to [facility] >60%     Status  On-going      PT LONG TERM GOAL #3   Title  Pt will improve FOTO to less than 50% limited to show improved  function    Status  On-going      PT LONG TERM GOAL #4   Title  Pt will reduce pain by overall 50%    Status  On-going            Plan - 10/15/18 0839    Clinical Impression Statement  Shorted session due to him having to take a phone call for 20 min so some therex had to be held due to time restriction. He opted for mechanical traction vs more exercise today. Continue POC    PT Treatment/Interventions  Cryotherapy;Electrical Stimulation;Iontophoresis 4mg /ml Dexamethasone;Moist Heat;Ultrasound;Therapeutic activities;Neuromuscular re-education;Manual techniques;Passive range of motion;Dry needling;Taping;Spinal Manipulations;Joint Manipulations    PT Next Visit Plan  continue core activation, traction if appropriate    PT Home Exercise Plan  KTC, HSS, sitting flexion  stretch, supine marches, OH reach 5lb, chops    Consulted and Agree with Plan of Care  Patient       Patient will benefit from skilled therapeutic intervention in order to improve the following deficits and impairments:  Decreased activity tolerance, Decreased endurance, Decreased range of motion, Decreased strength, Hypomobility, Difficulty walking, Impaired flexibility, Increased muscle spasms, Pain, Postural dysfunction  Visit Diagnosis: 1. Chronic bilateral low back pain with right-sided sciatica   2. Difficulty in walking, not elsewhere classified        Problem List Patient Active Problem List   Diagnosis Date Noted  . 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 Torn meniscus 09/23/2018  . It band syndrome, right 08/18/2018  . Personal history of colonic polyps 07/28/2018  . Essential hypertension 07/28/2018  . Gastroesophageal reflux disease 07/28/2018  . Pain in left knee 05/15/2015    Marcus Armstrong 10/15/2018, 8:43 AM  [facility] [address] [place], [name], [address] Phone: [phone]   Fax:  [phone]  Name: Marcus Armstrong MRN: [id number] Date  of Birth: April 19, 1965

## 2018-10-20 ENCOUNTER — Ambulatory Visit: Payer: Managed Care, Other (non HMO) | Admitting: Physical Therapy

## 2018-10-20 ENCOUNTER — Other Ambulatory Visit: Payer: Self-pay

## 2018-10-20 DIAGNOSIS — M5441 Lumbago with sciatica, right side: Secondary | ICD-10-CM | POA: Diagnosis not present

## 2018-10-20 DIAGNOSIS — G8929 Other chronic pain: Secondary | ICD-10-CM

## 2018-10-20 DIAGNOSIS — R262 Difficulty in walking, not elsewhere classified: Secondary | ICD-10-CM

## 2018-10-20 NOTE — Therapy (Signed)
[place], [name], [address] Phone: [phone]   Fax:  [phone]  Physical Therapy Treatment  Patient Details  Name: Marcus Armstrong MRN: [id number] Date of Birth: 14-Sep-1965 Referring Provider (PT): Ronnald Nian, DO   Encounter Date: 10/20/2018  PT End of Session - 10/20/18 0909    Visit Number  10    Number of Visits  12    Date for PT Re-Evaluation  10/27/18    Authorization Type  cigna    PT Start Time  0830    PT Stop Time  0920    PT Time Calculation (min)  50 min    Activity Tolerance  Patient tolerated treatment well    Behavior During Therapy  [facility] for tasks assessed/performed       Past Medical History:  Diagnosis Date  . Hypertension     Past Surgical History:  Procedure Laterality Date  . polyp removal      There were no vitals filed for this visit.  Subjective Assessment - 10/20/18 0905    Subjective  Relays some morning stiffness in his back but overall he can tell it is improving and he can stand for longer periods of time before back pain    Pertinent History  PMH: HTN,root nerve tumors    How long can you sit comfortably?  30 min    How long can you stand comfortably?  1 hour    How long can you walk comfortably?  can not get all the way through supermarket without pain    Diagnostic tests  no recent    Patient Stated Goals  Try to get the back     Currently in Pain?  Yes    Pain Score  1     Pain Location  Back    Pain Orientation  Lower    Pain Descriptors / Indicators  Tightness    Pain Type  Chronic pain    Pain Onset  More than a month ago                       [facility] Adult PT Treatment/Exercise - 10/20/18 0001      Lumbar Exercises: Stretches   Single Knee to Chest Stretch  2 reps;30 seconds    Piriformis Stretch  Right;Left;2 reps;30 seconds      Lumbar Exercises: Aerobic   Nustep  7 min L6 UE/LE      Lumbar Exercises: Machines for  Strengthening   Cybex Knee Extension  10 lbs X 20 reps    Cybex Knee Flexion  25 lbs X 20 reps    Leg Press  75 lbs 2X15    Other Lumbar Machine Exercise  row machine 2 sets total one at each grip X 20 reps at 45 lbs    Other Lumbar Machine Exercise  lat pull 45 lbs 20, chest press 25 lbs 2X20 one set at each grip, free motion machine for cable walk outs 10 lbs bilat X 5 fwd and X 5 retro      Traction   Min (lbs)  85    Max (lbs)  105    Hold Time  60    Rest Time  20    Time  15                  PT Long Term Goals - 10/20/18 0910      PT LONG TERM GOAL #  1   Title  Pt will be I and compliant with HEP. (6 weeks 7/22 is target for all goals.)    Status  Achieved      PT LONG TERM GOAL #2   Title  Pt will improve lumbar ROM to [facility] >60%     Status  Achieved      PT LONG TERM GOAL #3   Title  Pt will improve FOTO to less than 50% limited to show improved function    Status  On-going      PT LONG TERM GOAL #4   Title  Pt will reduce pain by overall 50%    Status  Achieved            Plan - 10/20/18 0909    Clinical Impression Statement  He is making good progress with PT and is improving his standing activity tolerance and core strength. He was able to progress strengthening program today without complaints. Traction continued as he continues to report relief from this.    PT Treatment/Interventions  Cryotherapy;Electrical Stimulation;Iontophoresis 4mg /ml Dexamethasone;Moist Heat;Ultrasound;Therapeutic activities;Neuromuscular re-education;Manual techniques;Passive range of motion;Dry needling;Taping;Spinal Manipulations;Joint Manipulations    PT Next Visit Plan  continue core activation, traction if appropriate    PT Home Exercise Plan  KTC, HSS, sitting flexion stretch, supine marches, OH reach 5lb, chops    Consulted and Agree with Plan of Care  Patient       Patient will benefit from skilled therapeutic intervention in order to improve the following  deficits and impairments:  Decreased activity tolerance, Decreased endurance, Decreased range of motion, Decreased strength, Hypomobility, Difficulty walking, Impaired flexibility, Increased muscle spasms, Pain, Postural dysfunction  Visit Diagnosis: 1. Chronic bilateral low back pain with right-sided sciatica   2. Difficulty in walking, not elsewhere classified        Problem List Patient Active Problem List   Diagnosis Date Noted  . 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 Torn meniscus 09/23/2018  . It band syndrome, right 08/18/2018  . Personal history of colonic polyps 07/28/2018  . Essential hypertension 07/28/2018  . Gastroesophageal reflux disease 07/28/2018  . Pain in left knee 05/15/2015    Silvestre Mesi 10/20/2018, 9:12 AM  [facility] [address] [place], [name], [address] Phone: [phone]   Fax:  [phone]  Name: Marcus Armstrong MRN: [id number] Date of Birth: June 21, 1965

## 2018-10-22 ENCOUNTER — Other Ambulatory Visit: Payer: Self-pay

## 2018-10-22 ENCOUNTER — Ambulatory Visit
Admission: RE | Admit: 2018-10-22 | Discharge: 2018-10-22 | Disposition: A | Discharge status: Home / Self Care | Payer: Managed Care, Other (non HMO) | Source: Ambulatory Visit | Attending: Neurology | Admitting: Neurology

## 2018-10-22 ENCOUNTER — Ambulatory Visit: Payer: Managed Care, Other (non HMO) | Admitting: Physical Therapy

## 2018-10-22 DIAGNOSIS — R413 Other amnesia: Secondary | ICD-10-CM

## 2018-10-22 DIAGNOSIS — M5441 Lumbago with sciatica, right side: Secondary | ICD-10-CM | POA: Diagnosis not present

## 2018-10-22 DIAGNOSIS — R262 Difficulty in walking, not elsewhere classified: Secondary | ICD-10-CM

## 2018-10-22 DIAGNOSIS — D3610 Benign neoplasm of peripheral nerves and autonomic nervous system, unspecified: Secondary | ICD-10-CM

## 2018-10-22 DIAGNOSIS — G8929 Other chronic pain: Secondary | ICD-10-CM

## 2018-10-22 MED ORDER — GADOBENATE DIMEGLUMINE 529 MG/ML IV SOLN
15.0000 mL | Freq: Once | INTRAVENOUS | Status: AC | PRN
  Administered 2018-10-22: 14:00:00 15 mL via INTRAVENOUS

## 2018-10-22 NOTE — Therapy (Signed)
[place], [name], [address] Phone: [phone]   Fax:  [phone]  Physical Therapy Treatment  Patient Details  Name: Marcus Armstrong MRN: [id number] Date of Birth: 1965-10-14 Referring Provider (PT): Ronnald Nian, DO   Encounter Date: 10/22/2018  PT End of Session - 10/22/18 0954    Visit Number  11    Number of Visits  12    Date for PT Re-Evaluation  10/27/18    Authorization Type  cigna    PT Start Time  0805    PT Stop Time  0845    PT Time Calculation (min)  40 min    Activity Tolerance  Patient tolerated treatment well    Behavior During Therapy  [facility] for tasks assessed/performed       Past Medical History:  Diagnosis Date  . Hypertension     Past Surgical History:  Procedure Laterality Date  . polyp removal      There were no vitals filed for this visit.  Subjective Assessment - 10/22/18 0950    Subjective  He is feeling good this morning, no pain upon arrival, just some pressure in his back at times    Pertinent History  PMH: HTN,root nerve tumors    How long can you sit comfortably?  30 min    How long can you stand comfortably?  1 hour    How long can you walk comfortably?  can not get all the way through supermarket without pain    Diagnostic tests  no recent    Patient Stated Goals  Try to get the back     Currently in Pain?  No/denies    Pain Onset  More than a month ago         [facility] PT Assessment - 10/22/18 0001      AROM   Lumbar Flexion  75%    Lumbar Extension  50%    Lumbar - Right Side Bend  50%    Lumbar - Left Side Bend  50%    Lumbar - Right Rotation  75%    Lumbar - Left Rotation  75%                   [facility] Adult PT Treatment/Exercise - 10/22/18 0001      Lumbar Exercises: Stretches   Other Lumbar Stretch Exercise  seated P ball roll outs for flexon stretch 10 sec X 10      Lumbar Exercises: Aerobic   Elliptical  7 min L1      Lumbar Exercises: Machines for Strengthening   Cybex Knee Extension  15 lbs X 20 reps    Cybex Knee Flexion  35 lbs X 20 reps    Leg Press  85 lbs 2X15    Other Lumbar Machine Exercise  row machine 2 sets total one at each grip X 20 reps at 45 lbs    Other Lumbar Machine Exercise  chest press 25 lbs 2X20 one set at each grip, free motion machine for cable walk outs 13 lbs bilat X 5 fwd and X 5 retro, then D2 extension chops from high position on cable machine X 10 lbs X 15 each side      Traction   Type of Traction  Other (comment)   declined today to try how he does without it                 PT Long Term  Goals - 10/20/18 0910      PT LONG TERM GOAL #1   Title  Pt will be I and compliant with HEP. (6 weeks 7/22 is target for all goals.)    Status  Achieved      PT LONG TERM GOAL #2   Title  Pt will improve lumbar ROM to [facility] >60%     Status  Achieved      PT LONG TERM GOAL #3   Title  Pt will improve FOTO to less than 50% limited to show improved function    Status  On-going      PT LONG TERM GOAL #4   Title  Pt will reduce pain by overall 50%    Status  Achieved            Plan - 10/22/18 0954    Clinical Impression Statement  Continued exercise progression as he was not having pain and traciton held today to see how he does without it. He was in agreement with this. He will be at end of POC next visit so will need to discuss with him extending PT or discharge to HEP and transition to gym.    PT Treatment/Interventions  Cryotherapy;Electrical Stimulation;Iontophoresis 4mg /ml Dexamethasone;Moist Heat;Ultrasound;Therapeutic activities;Neuromuscular re-education;Manual techniques;Passive range of motion;Dry needling;Taping;Spinal Manipulations;Joint Manipulations    PT Next Visit Plan  needs reassessment for recert or DC    PT Home Exercise Plan  KTC, HSS, sitting flexion stretch, supine marches, OH reach 5lb, chops    Consulted and Agree with Plan of Care  Patient        Patient will benefit from skilled therapeutic intervention in order to improve the following deficits and impairments:  Decreased activity tolerance, Decreased endurance, Decreased range of motion, Decreased strength, Hypomobility, Difficulty walking, Impaired flexibility, Increased muscle spasms, Pain, Postural dysfunction  Visit Diagnosis: 1. Chronic bilateral low back pain with right-sided sciatica   2. Difficulty in walking, not elsewhere classified        Problem List Patient Active Problem List   Diagnosis Date Noted  . 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 Torn meniscus 09/23/2018  . It band syndrome, right 08/18/2018  . Personal history of colonic polyps 07/28/2018  . Essential hypertension 07/28/2018  . Gastroesophageal reflux disease 07/28/2018  . Pain in left knee 05/15/2015    Marcus Armstrong 10/22/2018, 9:57 AM  [facility] [address] [place], [name], [address] Phone: [phone]   Fax:  [phone]  Name: Marcus Armstrong MRN: [id number] Date of Birth: [birth year]

## 2018-10-26 ENCOUNTER — Telehealth: Payer: Self-pay

## 2018-10-26 NOTE — Telephone Encounter (Signed)
Pt made aware of MRI results. He does want to be referred locally to a Neurosurgeon to follow up on Neurofibromas.  Dr. Delice Lesch, where would you like for pt to be seen?

## 2018-10-26 NOTE — Telephone Encounter (Signed)
-----   Message from Cameron Sprang, MD sent at 10/25/2018  9:54 AM EDT ----- Pls let him know MRI brain looks good, no evidence of tumor, stroke, bleed, or neurofibromas in the brain. His MRI lumbar spine shows the neurofibromas which he had mentioned to me, does he want to see a neurosurgeon locally to follow this? If yes, pls send referral. Thanks

## 2018-10-26 NOTE — Telephone Encounter (Signed)
Left message for new patient coordinator [name]) at [facility] [phone].

## 2018-10-26 NOTE — Telephone Encounter (Signed)
Pls send referral to [facility], thanks

## 2018-10-26 NOTE — Telephone Encounter (Signed)
Spoke with Marcus Armstrong. Pts demos, ins card, Imaging faxed to [phone]. They will contact pt with appt date and time. Pt made aware.

## 2018-10-27 ENCOUNTER — Other Ambulatory Visit: Payer: Self-pay

## 2018-10-27 ENCOUNTER — Encounter: Payer: Self-pay | Admitting: Family Medicine

## 2018-10-27 ENCOUNTER — Ambulatory Visit: Payer: Managed Care, Other (non HMO) | Admitting: Physical Therapy

## 2018-10-27 DIAGNOSIS — M5441 Lumbago with sciatica, right side: Secondary | ICD-10-CM | POA: Diagnosis not present

## 2018-10-27 DIAGNOSIS — Q8500 Neurofibromatosis, unspecified: Secondary | ICD-10-CM | POA: Insufficient documentation

## 2018-10-27 DIAGNOSIS — G8929 Other chronic pain: Secondary | ICD-10-CM

## 2018-10-27 DIAGNOSIS — R262 Difficulty in walking, not elsewhere classified: Secondary | ICD-10-CM

## 2018-10-27 NOTE — Therapy (Signed)
[place], [name], [address] Phone: [phone]   Fax:  [phone]  Physical Therapy Treatment/Recert  Patient Details  Name: Marcus Armstrong MRN: [id number] Date of Birth: [birth year] Referring Provider (PT): Ronnald Nian, DO   Encounter Date: 10/27/2018  PT End of Session - 10/27/18 0904    Visit Number  12    Number of Visits  20    Date for PT Re-Evaluation  12/01/18    Authorization Type  cigna    PT Start Time  0830    PT Stop Time  0900   had to leave 15 min early for job interview   PT Time Calculation (min)  30 min    Activity Tolerance  Patient tolerated treatment well    Behavior During Therapy  [facility] for tasks assessed/performed       Past Medical History:  Diagnosis Date  . Hypertension     Past Surgical History:  Procedure Laterality Date  . polyp removal      There were no vitals filed for this visit.  Subjective Assessment - 10/27/18 0920    Subjective  Relays he feels he is getting stronger in his core and back but still has pain and still has difficulty with prolonged walking or standing and feels he needs to continue PT.    Pertinent History  PMH: HTN,root nerve tumors    How long can you sit comfortably?  30 min    How long can you stand comfortably?  1 hour    How long can you walk comfortably?  can not get all the way through supermarket without pain    Diagnostic tests  no recent    Patient Stated Goals  Try to get the back     Currently in Pain?  Yes    Pain Score  4     Pain Location  Back    Pain Descriptors / Indicators  Tiring    Pain Onset  More than a month ago         [facility] PT Assessment - 10/27/18 0001      Assessment   Medical Diagnosis  Acute on Chronic bilateral low back pain with Rtsciatica with back spams/strain.    Referring Provider (PT)  Ronnald Nian, DO    Next MD Visit  not scheduled      Observation/Other Assessments   Focus on  Therapeutic Outcomes (FOTO)   48% limited improved from 68% limited on eval      AROM   Lumbar Flexion  can get 75% but painful only 50% if pain free    Lumbar Extension  75%    Lumbar - Right Side Bend  90%    Lumbar - Left Side Bend  90%    Lumbar - Right Rotation  75%    Lumbar - Left Rotation  90%      Strength   Overall Strength Comments  LE strength WNL grossly in sitting except hip flexion and abduction 4+/5 bilat      Special Tests   Other special tests  now negative SLUMP test bilat                   [facility] Adult PT Treatment/Exercise - 10/27/18 0001      Lumbar Exercises: Stretches   Passive Hamstring Stretch  Right;Left;2 reps;30 seconds    Passive Hamstring Stretch Limitations  seated    Other Lumbar Stretch Exercise  seated  P ball roll outs for flexon stretch 10 sec X 5      Lumbar Exercises: Aerobic   Elliptical  7 min L1      Lumbar Exercises: Machines for Strengthening   Leg Press  85 lbs 2X15    Other Lumbar Machine Exercise  row machine 2 sets total one at each grip X 20 reps at 45 lbs      Lumbar Exercises: Supine   Straight Leg Raise  20 reps    Straight Leg Raises Limitations  Rt 2 reps, Lt one rep                  PT Long Term Goals - 10/27/18 0924      PT LONG TERM GOAL #1   Title  Pt will be I and compliant with HEP. (6 weeks 7/22 is target for all goals.)    Status  Achieved      PT LONG TERM GOAL #2   Title  Pt will improve lumbar ROM to [facility] >60%     Baseline  50% lumbar flexion if staying in pain free ROM    Status  Partially Met      PT LONG TERM GOAL #3   Title  Pt will improve FOTO to less than 50% limited to show improved function    Baseline  now 48% limited    Status  Achieved      PT LONG TERM GOAL #4   Title  Pt will reduce pain by overall 50%    Status  Achieved      PT LONG TERM GOAL #5   Title  new goal. He will be able to stand at least one hour and walk at least .5 miles with less than 4/10 LBP     Baseline  can walk .25 miles and stand only half hour at most            Plan - 10/27/18 0905    Clinical Impression Statement  Recert performed today as he was out of POC. He has made progress with PT in core strength, increased functional abilites, and decreased pain however he still has deficits in these areas, still has Lt hip flexor weakness, and still has some back pain with walking more than .25 miles or standing more than 30 minutes. He will benefit from another 4 weeks of PT to adress these deficits.    PT Treatment/Interventions  Cryotherapy;Electrical Stimulation;Iontophoresis 47m/ml Dexamethasone;Moist Heat;Ultrasound;Therapeutic activities;Neuromuscular re-education;Manual techniques;Passive range of motion;Dry needling;Taping;Spinal Manipulations;Joint Manipulations    PT Next Visit Plan  continue stretching and functional strenghtening, traction as needed    PT Home Exercise Plan  KTC, HSS, sitting flexion stretch, supine marches, OH reach 5lb, chops    Consulted and Agree with Plan of Care  Patient       Patient will benefit from skilled therapeutic intervention in order to improve the following deficits and impairments:  Decreased activity tolerance, Decreased endurance, Decreased range of motion, Decreased strength, Hypomobility, Difficulty walking, Impaired flexibility, Increased muscle spasms, Pain, Postural dysfunction  Visit Diagnosis: 1. Chronic bilateral low back pain with right-sided sciatica   2. Difficulty in walking, not elsewhere classified        Problem List Patient Active Problem List   Diagnosis Date Noted  . 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 Torn meniscus 09/23/2018  . It band syndrome, right 08/18/2018  . Personal history of colonic polyps 07/28/2018  . Essential hypertension 07/28/2018  . Gastroesophageal reflux disease 07/28/2018  .  Pain in left knee 05/15/2015    Debbe Odea ,PT,DPT 10/27/2018, 9:27 AM  [facility] [address] [place], [name], [address] Phone: [phone]   Fax:  [phone]  Name: Kevaughn Ewing MRN: [id number] Date of Birth: 1966-01-20

## 2018-10-28 ENCOUNTER — Telehealth (INDEPENDENT_AMBULATORY_CARE_PROVIDER_SITE_OTHER): Payer: Managed Care, Other (non HMO) | Admitting: Family Medicine

## 2018-10-28 ENCOUNTER — Encounter: Payer: Self-pay | Admitting: Family Medicine

## 2018-10-28 VITALS — BP 124/84 | Wt 261.0 lb

## 2018-10-28 DIAGNOSIS — Q7649 Other congenital malformations of spine, not associated with scoliosis: Secondary | ICD-10-CM | POA: Diagnosis not present

## 2018-10-28 DIAGNOSIS — G8929 Other chronic pain: Secondary | ICD-10-CM | POA: Diagnosis not present

## 2018-10-28 DIAGNOSIS — M5441 Lumbago with sciatica, right side: Secondary | ICD-10-CM

## 2018-10-28 DIAGNOSIS — M5136 Other intervertebral disc degeneration, lumbar region: Secondary | ICD-10-CM | POA: Diagnosis not present

## 2018-10-28 NOTE — Progress Notes (Signed)
Virtual Visit via Video Note  I connected with Marcus Armstrong on 10/28/18 at  8:30 AM EDT by a video enabled telemedicine application and verified that I am speaking with the correct person using two identifiers. Location patient: home Location provider: work  Persons participating in the virtual visit: patient, provider  I discussed the limitations of evaluation and management by telemedicine and the availability of in person appointments. The patient expressed understanding and agreed to proceed.  Chief Complaint  Patient presents with  . Follow-up    had MRI yesterday/one of legs is getting smaller than other/ one of his spine tumors is pinching off something and neurologist maybe wanted to do surgery/return to work form/     HPI: Marcus Armstrong is a 53 y.o. male to follow-up on his chronic B/L low back pain with Rt sciatica. Pt has completed 12 PT sessions and feels this has been helpful. He is able to stand longer and walk farther without pain. He estimates about a 40-50% improvement overall. Physical therapist recommended additional 4 weeks of PT going 2x/wk. He saw neurosurgeon at [place] (he cannot recall doctor's name). 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 Pt states additional imagining was recommended and he will be contacted to schedule this. After imaging is done, a f/u appt will be schedule with neurosurgeon. Pt states on exam neurosurgeon noted muscle atrophy in pts RLE. He feels surgery may be the best option for long term relief and improvement, as per pt. We will call [facility] to get OV note from yesterday faxed here for my review.   Past Medical History:  Diagnosis Date  . Hypertension     Past Surgical History:  Procedure Laterality Date  . polyp removal      Family History  Problem Relation Age of Onset  . Cancer Father 63       prostate     Social History   Tobacco Use  . Smoking status: Current Some Day Smoker    Packs/day: 0.25    Years: 30.00    Pack years: 7.50    Types: Cigarettes    Last attempt to quit: 05/08/2018    Years since quitting: 0.4  . Smokeless tobacco: Never Used  Substance Use Topics  . Alcohol use: Not Currently    Frequency: Never  . 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  amLODipine (NORVASC) 10 MG tablet, Take 1 tablet (10 mg total) by mouth daily., Disp: 90 tablet, Rfl: 3 .  cetirizine (ZYRTEC) 10 MG tablet, TK 1 T PO  D, Disp: , Rfl:  .  cyclobenzaprine (FLEXERIL) 10 MG tablet, Take 1 tablet (10 mg total) by mouth 3 (three) times daily as needed for muscle spasms., Disp: 60 tablet, Rfl: 0 .  Diclofenac Sodium (PENNSAID) 2 % SOLN, Place 1 application onto the skin 2 (two) times daily., Disp: 1 Bottle, Rfl: 3 .  fluticasone (FLONASE) 50 MCG/ACT nasal spray, Place into both nostrils daily., Disp: , Rfl:  .  gabapentin (NEURONTIN) 300 MG capsule, , Disp: , Rfl:  .  ibuprofen (ADVIL) 800 MG tablet, Take 1 tablet (800 mg total) by mouth every 8 (eight) hours as needed., Disp: 90 tablet, Rfl: 0 .  losartan-hydrochlorothiazide (HYZAAR) 100-25 MG tablet, Take 1 tablet by mouth daily.,  Disp: 90 tablet, Rfl: 3 .  MYRBETRIQ 25 MG TB24 tablet, TK 1 T PO QD, Disp: , Rfl:  .  omeprazole (PRILOSEC) 20 MG capsule, Take 1 capsule (20 mg total) by mouth 2 (two) times daily before a meal., Disp: 180 capsule, Rfl: 0  Allergies  Allergen Reactions  . Cat Hair Extract Hives, Other (See Comments), Rash, Shortness Of Breath and Swelling  . Shellfish Allergy Swelling      ROS: See pertinent positives and negatives per HPI.   EXAM:  VITALS per patient if applicable:  GENERAL: alert, oriented, appears well and in no acute distress  HEENT: atraumatic, conjunctiva clear, no obvious  abnormalities on inspection of external nose and ears  NECK: normal movements of the head and neck  LUNGS: on inspection no signs of respiratory distress, breathing rate appears normal, no obvious gross SOB, gasping or wheezing, no conversational dyspnea  CV: no obvious cyanosis  PSYCH/NEURO: pleasant and cooperative, no obvious depression or anxiety, speech and thought processing grossly intact   ASSESSMENT AND PLAN: 1. Congenital spinal stenosis of lumbar region 2. Chronic right-sided low back pain with right-sided sciatica 3. Degenerative disc disease, lumbar - cont with PT as recommended 2x/wk x 4wks - cont with home exercises - will contact [facility] [phone]) to get records from pts OV on 10/27/18 - form for pts work/disability insurance completed and will fax along with all requested records once above OV note is received - pt will need to remain out of work for 6-8 weeks and possibly longer depending on whether or not spinal surgery is recommended    I discussed the assessment and treatment plan with the patient. The patient was provided an opportunity to ask questions and all were answered. The patient agreed with the plan and demonstrated an understanding of the instructions.  I personally spent 25 min with the patient today and greater than 50% was spent in counseling, coordination of care, education    The patient was advised to call back or seek an in-person evaluation if the symptoms worsen or if the condition fails to improve as anticipated.   [name], DO

## 2018-10-29 ENCOUNTER — Ambulatory Visit: Payer: Managed Care, Other (non HMO) | Admitting: Physical Therapy

## 2018-10-29 ENCOUNTER — Other Ambulatory Visit: Payer: Self-pay

## 2018-10-29 DIAGNOSIS — G8929 Other chronic pain: Secondary | ICD-10-CM

## 2018-10-29 DIAGNOSIS — M5441 Lumbago with sciatica, right side: Secondary | ICD-10-CM | POA: Diagnosis not present

## 2018-10-29 DIAGNOSIS — R262 Difficulty in walking, not elsewhere classified: Secondary | ICD-10-CM

## 2018-10-29 NOTE — Therapy (Signed)
[place], [name], [address] Phone: [phone]   Fax:  [phone]  Physical Therapy Treatment  Patient Details  Name: Marcus Armstrong MRN: [id number] Date of Birth: 1965/10/08 Referring Provider (PT): Ronnald Nian, DO   Encounter Date: 10/29/2018  PT End of Session - 10/29/18 0835    Visit Number  13    Number of Visits  20    Date for PT Re-Evaluation  12/01/18    Authorization Type  cigna    PT Start Time  0805    PT Stop Time  0850    PT Time Calculation (min)  45 min    Activity Tolerance  Patient tolerated treatment well    Behavior During Therapy  [facility] for tasks assessed/performed       Past Medical History:  Diagnosis Date  . Hypertension     Past Surgical History:  Procedure Laterality Date  . polyp removal      There were no vitals filed for this visit.  Subjective Assessment - 10/29/18 0826    Subjective  Relays MD wants him to continue with PT however he may need surgery due to stenosis and some Lt leg atrophy    Pertinent History  PMH: HTN,root nerve tumors    How long can you sit comfortably?  30 min    How long can you stand comfortably?  1 hour    How long can you walk comfortably?  can not get all the way through supermarket without pain    Diagnostic tests  no recent    Patient Stated Goals  Try to get the back     Currently in Pain?  Yes    Pain Score  3     Pain Location  Back    Pain Orientation  Lower    Pain Descriptors / Indicators  Tightness;Aching    Pain Type  Chronic pain    Pain Onset  More than a month ago                       [facility] Adult PT Treatment/Exercise - 10/29/18 0001      Lumbar Exercises: Stretches   Passive Hamstring Stretch  Right;Left;2 reps;30 seconds    Passive Hamstring Stretch Limitations  seated    Other Lumbar Stretch Exercise  seated P ball roll outs for flexon stretch 10 sec X 5    Other Lumbar Stretch  Exercise  child pose 20 sec  X3      Lumbar Exercises: Aerobic   Elliptical  7 min L1      Lumbar Exercises: Machines for Strengthening   Cybex Knee Extension  15 lbs 2X15    Cybex Knee Flexion  35 lbs X 2X15    Leg Press  85 lbs 2X15    Other Lumbar Machine Exercise  row machine 2 sets total one at each grip 2X15 reps at 45 lbs, lat pull 45 lbs X 20 reps    Other Lumbar Machine Exercise  free motion machine for cable walk outs 13 lbs bilat X 5 fwd and X 5 retro,      Lumbar Exercises: Supine   Straight Leg Raises Limitations  3X 10 bilat                  PT Long Term Goals - 10/27/18 6060      PT LONG TERM GOAL #1   Title  Pt will be  I and compliant with HEP. (6 weeks 7/22 is target for all goals.)    Status  Achieved      PT LONG TERM GOAL #2   Title  Pt will improve lumbar ROM to [facility] >60%     Baseline  50% lumbar flexion if staying in pain free ROM    Status  Partially Met      PT LONG TERM GOAL #3   Title  Pt will improve FOTO to less than 50% limited to show improved function    Baseline  now 48% limited    Status  Achieved      PT LONG TERM GOAL #4   Title  Pt will reduce pain by overall 50%    Status  Achieved      PT LONG TERM GOAL #5   Title  new goal. He will be able to stand at least one hour and walk at least .5 miles with less than 4/10 LBP    Baseline  can walk .25 miles and stand only half hour at most            Plan - 10/29/18 7680    Clinical Impression Statement  Session focused on stretching and strength progression with good tolerance.Added more hip strengthening today as he is still weak there.  PT will continue to progress as able.    PT Treatment/Interventions  Cryotherapy;Electrical Stimulation;Iontophoresis 22m/ml Dexamethasone;Moist Heat;Ultrasound;Therapeutic activities;Neuromuscular re-education;Manual techniques;Passive range of motion;Dry needling;Taping;Spinal Manipulations;Joint Manipulations    PT Next Visit Plan   continue stretching and functional strenghtening, traction as needed    PT Home Exercise Plan  KTC, HSS, sitting flexion stretch, supine marches, OH reach 5lb, chops    Consulted and Agree with Plan of Care  Patient       Patient will benefit from skilled therapeutic intervention in order to improve the following deficits and impairments:  Decreased activity tolerance, Decreased endurance, Decreased range of motion, Decreased strength, Hypomobility, Difficulty walking, Impaired flexibility, Increased muscle spasms, Pain, Postural dysfunction  Visit Diagnosis: 1. Chronic bilateral low back pain with right-sided sciatica   2. Difficulty in walking, not elsewhere classified        Problem List Patient Active Problem List   Diagnosis Date Noted  . Congenital spinal stenosis of lumbar region 10/28/2018  . 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 Torn meniscus 09/23/2018  . It band syndrome, right 08/18/2018  . Personal history of colonic polyps 07/28/2018  . Essential hypertension 07/28/2018  . Gastroesophageal reflux disease 07/28/2018  . Pain in left knee 05/15/2015    BSilvestre Mesi7/24/2020, 9:07 AM  [facility] [address]Phone: [phone]  Fax:  [phone] Name: Marcus GuariscoMRN: [id number] of Birth: 122-Oct-1967

## 2018-11-01 ENCOUNTER — Other Ambulatory Visit: Payer: Self-pay | Admitting: Neurosurgery

## 2018-11-01 DIAGNOSIS — Q8500 Neurofibromatosis, unspecified: Secondary | ICD-10-CM

## 2018-11-02 ENCOUNTER — Telehealth: Payer: Self-pay | Admitting: Nurse Practitioner

## 2018-11-02 NOTE — Telephone Encounter (Signed)
Phone call to patient to verify medication list and allergies for myelogram procedure. Pt aware he will not need to hold any medications for this procedure. Pre and post procedure instructions reviewed with pt. 

## 2018-11-04 ENCOUNTER — Other Ambulatory Visit: Payer: Self-pay

## 2018-11-04 ENCOUNTER — Encounter: Payer: Self-pay | Admitting: Physical Therapy

## 2018-11-04 ENCOUNTER — Ambulatory Visit: Payer: Managed Care, Other (non HMO) | Admitting: Physical Therapy

## 2018-11-04 DIAGNOSIS — G8929 Other chronic pain: Secondary | ICD-10-CM

## 2018-11-04 DIAGNOSIS — R262 Difficulty in walking, not elsewhere classified: Secondary | ICD-10-CM

## 2018-11-04 DIAGNOSIS — M5441 Lumbago with sciatica, right side: Secondary | ICD-10-CM | POA: Diagnosis not present

## 2018-11-04 NOTE — Therapy (Addendum)
[place], [name], [address] Phone: [phone]   Fax:  [phone]  Physical Therapy Treatment/Discharge addendum PHYSICAL THERAPY DISCHARGE SUMMARY  Visits from Start of Care: 14  Current functional level related to goals / functional outcomes: See below   Remaining deficits: See below   Education / Equipment: HEP Plan: Patient agrees to discharge.  Patient goals were partially met. Patient is being discharged due to not returning since the last visit.  ?????      Marcus Armstrong, PT, DPT 01/13/19 10:15 AM   Patient Details  Name: Marcus Armstrong MRN: [id number] Date of Birth: 1965-04-26 Referring Provider (PT): Ronnald Nian, DO   Encounter Date: 11/04/2018  PT End of Session - 11/04/18 1127    Visit Number  14    Number of Visits  20    Date for PT Re-Evaluation  12/01/18    PT Start Time  1120    PT Stop Time  1210    PT Time Calculation (min)  50 min       Past Medical History:  Diagnosis Date  . Hypertension     Past Surgical History:  Procedure Laterality Date  . polyp removal      There were no vitals filed for this visit.  Subjective Assessment - 11/04/18 1123    Subjective  I am having the least amount of pain  have had. Pain is 1-2/10.    Currently in Pain?  Yes    Pain Score  2     Pain Location  Back    Pain Orientation  Lower    Pain Descriptors / Indicators  Discomfort    Pain Type  Chronic pain    Aggravating Factors   standing    Pain Relieving Factors  heat, hot shower                       [facility] Adult PT Treatment/Exercise - 11/04/18 0001      Lumbar Exercises: Stretches   Passive Hamstring Stretch  Right;Left;2 reps;30 seconds    Passive Hamstring Stretch Limitations  foot on stool    Lower Trunk Rotation  10 seconds    Lower Trunk Rotation Limitations  5 reps     Hip Flexor Stretch  1 rep    Hip Flexor Stretch Limitations  edge  of seat     Piriformis Stretch  Right;Left;2 reps;30 seconds    Other Lumbar Stretch Exercise  seated P ball roll outs for flexon stretch 10 sec X 5   used rolling stool   Other Lumbar Stretch Exercise  child pose 20 sec  X3      Lumbar Exercises: Aerobic   Elliptical  87mn L5      Lumbar Exercises: Machines for Strengthening   Cybex Knee Extension  15 lbs 2X20   one set single leg , Rt    Cybex Knee Flexion  35 lbs X 2X20    Leg Press  85 lbs 2X20    Other Lumbar Machine Exercise  row machine 2 sets total one at each grip 2X20 reps at 45 lbs, lat pull 45 lbs X 20 reps, 2 sets    Other Lumbar Machine Exercise  free motion machine for cable walk outs 13 lbs bilat X 5 fwd and X 5 retro,17 lb D2 extension chops       Lumbar Exercises: Supine   Dead Bug  20 reps  Dead Bug Limitations  with alt knee extension      Straight Leg Raises Limitations  --    Other Supine Lumbar Exercises  oblique crunches x 10                   PT Long Term Goals - 10/27/18 0924      PT LONG TERM GOAL #1   Title  Pt will be I and compliant with HEP. (6 weeks 7/22 is target for all goals.)    Status  Achieved      PT LONG TERM GOAL #2   Title  Pt will improve lumbar ROM to [facility] >60%     Baseline  50% lumbar flexion if staying in pain free ROM    Status  Partially Met      PT LONG TERM GOAL #3   Title  Pt will improve FOTO to less than 50% limited to show improved function    Baseline  now 48% limited    Status  Achieved      PT LONG TERM GOAL #4   Title  Pt will reduce pain by overall 50%    Status  Achieved      PT LONG TERM GOAL #5   Title  new goal. He will be able to stand at least one hour and walk at least .5 miles with less than 4/10 LBP    Baseline  can walk .25 miles and stand only half hour at most            Plan - 11/04/18 1206    Clinical Impression Statement  Pt reports significant improvement in standing tolerance. He can stand for 1-2 hours standing/walking  while fishing.    PT Next Visit Plan  continue stretching and functional strenghtening, traction as needed    PT Home Exercise Plan  KTC, HSS, sitting flexion stretch, supine marches, OH reach 5lb, chops       Patient will benefit from skilled therapeutic intervention in order to improve the following deficits and impairments:  Decreased activity tolerance, Decreased endurance, Decreased range of motion, Decreased strength, Hypomobility, Difficulty walking, Impaired flexibility, Increased muscle spasms, Pain, Postural dysfunction  Visit Diagnosis: 1. Chronic bilateral low back pain with right-sided sciatica   2. Difficulty in walking, not elsewhere classified        Problem List Patient Active Problem List   Diagnosis Date Noted  . Congenital spinal stenosis of lumbar region 10/28/2018  . 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 Torn meniscus 09/23/2018  . It band syndrome, right 08/18/2018  . Personal history of colonic polyps 07/28/2018  . Essential hypertension 07/28/2018  . Gastroesophageal reflux disease 07/28/2018  . Inflammation of right sacroiliac joint ([place]) 05/08/2016  . Pain in left knee 05/15/2015    Dorene Ar, PTA 11/04/2018, 12:18 PM  [facility] [address] [place], [name], [address] Phone: [phone]   Fax:  [phone]  Name: Marcus Armstrong MRN: [id number] Date of Birth: 01-Dec-1965

## 2018-11-05 ENCOUNTER — Encounter: Payer: Self-pay | Admitting: Family Medicine

## 2018-11-22 NOTE — Discharge Instructions (Signed)

## 2018-11-23 ENCOUNTER — Ambulatory Visit
Admission: RE | Admit: 2018-11-23 | Discharge: 2018-11-23 | Disposition: A | Discharge status: Home / Self Care | Payer: Managed Care, Other (non HMO) | Source: Ambulatory Visit | Attending: Neurosurgery | Admitting: Neurosurgery

## 2018-11-23 VITALS — BP 135/78 | HR 72

## 2018-11-23 DIAGNOSIS — Q8500 Neurofibromatosis, unspecified: Secondary | ICD-10-CM

## 2018-11-23 DIAGNOSIS — Q7649 Other congenital malformations of spine, not associated with scoliosis: Secondary | ICD-10-CM

## 2018-11-23 MED ORDER — DIAZEPAM 5 MG PO TABS
10.0000 mg | ORAL_TABLET | Freq: Once | ORAL | Status: AC
  Administered 2018-11-23: 5 mg via ORAL

## 2018-11-23 MED ORDER — IOPAMIDOL (ISOVUE-M 200) INJECTION 41%
20.0000 mL | Freq: Once | INTRAMUSCULAR | Status: AC
  Administered 2018-11-23: 14:00:00 20 mL via INTRATHECAL

## 2018-11-26 ENCOUNTER — Telehealth: Payer: Self-pay | Admitting: Nurse Practitioner

## 2018-11-26 ENCOUNTER — Encounter: Payer: Self-pay | Admitting: Family Medicine

## 2018-11-26 ENCOUNTER — Telehealth (INDEPENDENT_AMBULATORY_CARE_PROVIDER_SITE_OTHER): Payer: Managed Care, Other (non HMO) | Admitting: Family Medicine

## 2018-11-26 ENCOUNTER — Other Ambulatory Visit: Payer: Self-pay

## 2018-11-26 DIAGNOSIS — H1033 Unspecified acute conjunctivitis, bilateral: Secondary | ICD-10-CM

## 2018-11-26 MED ORDER — POLYMYXIN B-TRIMETHOPRIM 10000-0.1 UNIT/ML-% OP SOLN: 1.0000 [drp] | OPHTHALMIC | 0 refills | Status: AC

## 2018-11-26 NOTE — Progress Notes (Signed)
Virtual Visit via Video Note  I connected with Marcus Armstrong on 11/26/18 at  9:30 AM EDT by a video enabled telemedicine application and verified that I am speaking with the correct person using two identifiers. Location patient: home Location provider: work Persons participating in the virtual visit: patient, provider  I discussed the limitations of evaluation and management by telemedicine and the availability of in person appointments. The patient expressed understanding and agreed to proceed.  Chief Complaint  Patient presents with  . Itchy Eye    very red irritated eyes/crusty in morning/allergy visine/4 days     HPI: Marcus Armstrong is a 53 y.o. male who complains of B/L eye redness, irritation, itchy x 4 days. Pt states in the AM, his eyes are very "crusty" and have been "almost sealed shut" the past few mornings.  No fever, chills. No vision changes. No injury of trauma. Pt has tried Visine allergy eye drops with minimal relief.    Past Medical History:  Diagnosis Date  . Hypertension     Past Surgical History:  Procedure Laterality Date  . polyp removal      Family History  Problem Relation Age of Onset  . Cancer Father 75       prostate    Social History   Tobacco Use  . Smoking status: Current Some Day Smoker    Packs/day: 0.25    Years: 30.00    Pack years: 7.50    Types: Cigarettes    Last attempt to quit: 05/08/2018    Years since quitting: 0.5  . Smokeless tobacco: Never Used  Substance Use Topics  . Alcohol use: Not Currently    Frequency: Never  . 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  amLODipine (NORVASC) 10 MG tablet, Take 1 tablet (10 mg total) by mouth daily., Disp: 90 tablet, Rfl: 3 .  cetirizine (ZYRTEC) 10 MG tablet, TK 1 T PO  D, Disp: , Rfl:  .  cyclobenzaprine (FLEXERIL) 10 MG tablet, Take 1 tablet (10 mg total) by mouth  3 (three) times daily as needed for muscle spasms., Disp: 60 tablet, Rfl: 0 .  fluticasone (FLONASE) 50 MCG/ACT nasal spray, Place into both nostrils daily., Disp: , Rfl:  .  gabapentin (NEURONTIN) 300 MG capsule, , Disp: , Rfl:  .  ibuprofen (ADVIL) 800 MG tablet, Take 1 tablet (800 mg total) by mouth every 8 (eight) hours as needed., Disp: 90 tablet, Rfl: 0 .  losartan-hydrochlorothiazide (HYZAAR) 100-25 MG tablet, Take 1 tablet by mouth daily., Disp: 90 tablet, Rfl: 3 .  MYRBETRIQ 25 MG TB24 tablet, TK 1 T PO QD, Disp: , Rfl:  .  omeprazole (PRILOSEC) 20 MG capsule, Take 1 capsule (20 mg total) by mouth 2 (two) times daily before a meal., Disp: 180 capsule, Rfl: 0 .  Diclofenac Sodium (PENNSAID) 2 % SOLN, Place 1 application onto the skin 2 (two) times daily. (Patient not taking: Reported on 11/02/2018), Disp: 1 Bottle, Rfl: 3  Allergies  Allergen Reactions  . Cat Hair Extract Hives, Other (See Comments), Rash, Shortness Of Breath and Swelling  . Shellfish Allergy Swelling      ROS: See pertinent positives and negatives per HPI.   EXAM:  VITALS per patient if applicable: There were no vitals taken for this visit.   GENERAL: alert, oriented, appears well and in no acute distress  EYES: B/L conjunctival erythema, minimal eyelid swelling, watery eyes B/L and copious pale yellow discharge from Rt > Lt eye  NECK: normal movements of the head and neck  LUNGS: on inspection no signs of respiratory distress, breathing rate appears normal, no obvious gross SOB, gasping or wheezing, no conversational dyspnea  CV: no obvious cyanosis  PSYCH/NEURO: pleasant and cooperative, speech and thought processing grossly intact   ASSESSMENT AND PLAN:  1. Acute bacterial conjunctivitis of both eyes Rx: - trimethoprim-polymyxin b (POLYTRIM) ophthalmic solution; Place 1 drop into both eyes every 4 (four) hours for 10 days.  Dispense: 10 mL; Refill: 0 - warm compress in AM - discuss importance of  hand hygiene, avoid rubbing eyes, change pillowcase and washcloth daily - f/u PRN Discussed plan and reviewed medications with patient, including risks, benefits, and potential side effects. Pt expressed understand. All questions answered.    I discussed the assessment and treatment plan with the patient. The patient was provided an opportunity to ask questions and all were answered. The patient agreed with the plan and demonstrated an understanding of the instructions.   The patient was advised to call back or seek an in-person evaluation if the symptoms worsen or if the condition fails to improve as anticipated.   [name], DO

## 2018-12-21 ENCOUNTER — Ambulatory Visit: Payer: Self-pay | Admitting: Family Medicine

## 2019-01-02 ENCOUNTER — Other Ambulatory Visit: Payer: Self-pay | Admitting: Family Medicine

## 2019-01-02 DIAGNOSIS — G8929 Other chronic pain: Secondary | ICD-10-CM

## 2019-01-03 NOTE — Telephone Encounter (Signed)
LVM for the pt to call back.

## 2019-01-05 ENCOUNTER — Encounter: Payer: Self-pay | Admitting: Family Medicine

## 2019-01-05 ENCOUNTER — Ambulatory Visit (INDEPENDENT_AMBULATORY_CARE_PROVIDER_SITE_OTHER): Payer: Managed Care, Other (non HMO) | Admitting: Family Medicine

## 2019-01-05 ENCOUNTER — Other Ambulatory Visit: Payer: Self-pay

## 2019-01-05 VITALS — BP 125/86 | HR 77 | Wt 256.8 lb

## 2019-01-05 DIAGNOSIS — L309 Dermatitis, unspecified: Secondary | ICD-10-CM

## 2019-01-05 DIAGNOSIS — I10 Essential (primary) hypertension: Secondary | ICD-10-CM | POA: Diagnosis not present

## 2019-01-05 DIAGNOSIS — R7303 Prediabetes: Secondary | ICD-10-CM

## 2019-01-05 LAB — COMPREHENSIVE METABOLIC PANEL
ALT: 19 U/L (ref 0–53)
AST: 18 U/L (ref 0–37)
Albumin: 4.4 g/dL (ref 3.5–5.2)
Alkaline Phosphatase: 66 U/L (ref 39–117)
BUN: 13 mg/dL (ref 6–23)
CO2: 22 mEq/L (ref 19–32)
Calcium: 9.6 mg/dL (ref 8.4–10.5)
Chloride: 106 mEq/L (ref 96–112)
Creatinine, Ser: 0.94 mg/dL (ref 0.40–1.50)
GFR: 101.21 mL/min (ref >60.00)
Glucose, Bld: 97 mg/dL (ref 70–99)
Potassium: 3.8 mEq/L (ref 3.5–5.1)
Sodium: 137 mEq/L (ref 135–145)
Total Bilirubin: 1 mg/dL (ref 0.2–1.2)
Total Protein: 6.7 g/dL (ref 6.0–8.3)

## 2019-01-05 LAB — HEMOGLOBIN A1C: Hgb A1c MFr Bld: 5.9 % (ref 4.6–6.5)

## 2019-01-05 MED ORDER — TRIAMCINOLONE ACETONIDE 0.1 % EX CREA: 1.0000 "application " | TOPICAL_CREAM | Freq: Two times a day (BID) | CUTANEOUS | 1 refills | Status: DC

## 2019-01-05 NOTE — Progress Notes (Signed)
Marcus Armstrong is a 53 y.o. male  Chief Complaint  Patient presents with  . Diabetes    HPI: Marcus Armstrong is a 53 y.o. male here for f/u on HTN. He has lost 5lbs in the past 2 mo. He states he had been down to 250lbs. Pt is still smoking cigarettes. He denies HA, vision changes, dizziness, CP, palpitations, SOB, LE edema.  He states the last time he had labs done he was told he is prediabetic. He has not followed up on that. A1C = 5.7.   Pt also complains of a rash in his Rt groin that has "dried up" but is still itchy. No vesicles or pustules. No fever, chills. Unsure about contacts/exposure. Present x 1 week.  He saw neuro Dr. Earnie Larsson, has CT myelogram done. Pt states spinal stenosis was shown to be not as bad as originally thought, but pt has narrowing of disc space. Surgery was recommended. Pt is not sure how he feels about this.    Past Medical History:  Diagnosis Date  . Hypertension     Past Surgical History:  Procedure Laterality Date  . polyp removal      Social History   Socioeconomic History  . Marital status: Divorced    Spouse name: Not on file  . Number of children: Not on file  . Years of education: Not on file  . Highest education level: Not on file  Occupational History  . Not on file  Social Needs  . Financial resource strain: Not on file  . Food insecurity    Worry: Not on file    Inability: Not on file  . Transportation needs    Medical: Not on file    Non-medical: Not on file  Tobacco Use  . Smoking status: Current Some Day Smoker    Packs/day: 0.25    Years: 30.00    Pack years: 7.50    Types: Cigarettes    Last attempt to quit: 05/08/2018    Years since quitting: 0.6  . Smokeless tobacco: Never Used  Substance and Sexual Activity  . Alcohol use: Not Currently    Frequency: Never  . 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 Sexual activity: Not on file   Lifestyle  . Physical activity    Days per week: Not on file    Minutes per session: Not on file  . Stress: Not on file  Relationships  . Social Herbalist on phone: Not on file    Gets together: Not on file    Attends religious service: Not on file    Active member of club or organization: Not on file    Attends meetings of clubs or organizations: Not on file    Relationship status: Not on file  . Intimate partner violence    Fear of current or ex partner: Not on file    Emotionally abused: Not on file    Physically abused: Not on file    Forced sexual activity: Not on file  Other Topics Concern  . Not on file  Social History Narrative   Right handed     Family History  Problem Relation Age of Onset  . Cancer Father 90       prostate      There is no immunization history on file for this patient.  Outpatient Encounter Medications as of 01/05/2019  Medication Sig  . amLODipine (NORVASC) 10 MG tablet Take 1 tablet (10 mg total) by mouth daily.  . cetirizine (ZYRTEC) 10 MG tablet TK 1 T PO  D  . cyclobenzaprine (FLEXERIL) 10 MG tablet Take 1 tablet (10 mg total) by mouth 3 (three) times daily as needed for muscle spasms.  . Diclofenac Sodium (PENNSAID) 2 % SOLN Place 1 application onto the skin 2 (two) times daily.  . fluticasone (FLONASE) 50 MCG/ACT nasal spray Place into both nostrils daily.  Marland Kitchen gabapentin (NEURONTIN) 300 MG capsule   . ibuprofen (ADVIL) 800 MG tablet Take 1 tablet (800 mg total) by mouth every 8 (eight) hours as needed.  Marland Kitchen losartan-hydrochlorothiazide (HYZAAR) 100-25 MG tablet Take 1 tablet by mouth daily.  Marland Kitchen MYRBETRIQ 25 MG TB24 tablet TK 1 T PO QD  . omeprazole (PRILOSEC) 20 MG capsule Take 1 capsule (20 mg total) by mouth 2 (two) times daily before a meal.   No facility-administered encounter medications on file as of 01/05/2019.      ROS: Pertinent positives and negatives noted in HPI. Remainder of ROS non-contributory    Allergies   Allergen Reactions  . Cat Hair Extract Hives, Other (See Comments), Rash, Shortness Of Breath and Swelling  . Shellfish Allergy Swelling    BP 125/86   Pulse 77   Wt 256 lb 12.8 oz (116.5 kg)   SpO2 94%   BMI 34.83 kg/m   BP Readings from Last 3 Encounters:  01/05/19 125/86  11/23/18 135/78  10/28/18 124/84   Wt Readings from Last 3 Encounters:  01/05/19 256 lb 12.8 oz (116.5 kg)  10/28/18 261 lb (118.4 kg)  09/21/18 254 lb (115.2 kg)     Physical Exam  Constitutional: He is oriented to person, place, and time. He appears well-developed and well-nourished. No distress.  Cardiovascular: Normal rate, regular rhythm, normal heart sounds and intact distal pulses.  Pulmonary/Chest: Effort normal and breath sounds normal. No respiratory distress.  Musculoskeletal:        General: No edema.  Lymphadenopathy:       Right: No inguinal adenopathy present.       Left: No inguinal adenopathy present.  Neurological: He is alert and oriented to person, place, and time.  Skin:     Psychiatric: He has a normal mood and affect. His behavior is normal.     A/P:  1. Essential hypertension - well-controlled, cont meds - Comprehensive metabolic panel  2. Prediabetes - Hemoglobin A1c - Comprehensive metabolic panel  3. Dermatitis Rx: - triamcinolone cream (KENALOG) 0.1 %; Apply 1 application topically 2 (two) times daily.  Dispense: 30 g; Refill: 1 - f/u in 2-3 wks if minimal or no improvement

## 2019-01-06 ENCOUNTER — Encounter: Payer: Self-pay | Admitting: Family Medicine

## 2019-01-11 ENCOUNTER — Telehealth: Payer: Self-pay

## 2019-01-11 DIAGNOSIS — G8929 Other chronic pain: Secondary | ICD-10-CM

## 2019-01-11 NOTE — Telephone Encounter (Signed)
Pharmacy faxed over refill request for Ibuprofen 800 mg. Okay to refill?

## 2019-01-13 MED ORDER — IBUPROFEN 800 MG PO TABS: 800.0000 mg | ORAL_TABLET | Freq: Three times a day (TID) | ORAL | 1 refills | Status: AC | PRN

## 2019-01-13 NOTE — Addendum Note (Signed)
Addended by: Ronnald Nian on: 01/13/2019 07:51 AM   Modules accepted: Orders

## 2019-01-13 NOTE — Telephone Encounter (Signed)
Med refilled to pharm on file

## 2019-01-24 ENCOUNTER — Encounter: Payer: Self-pay | Admitting: Family Medicine

## 2019-01-31 ENCOUNTER — Encounter: Payer: Self-pay | Admitting: Neurology

## 2019-02-08 ENCOUNTER — Encounter: Payer: Self-pay | Admitting: Family Medicine

## 2019-02-09 ENCOUNTER — Other Ambulatory Visit: Payer: Self-pay | Admitting: Neurosurgery

## 2019-02-23 ENCOUNTER — Other Ambulatory Visit: Payer: Self-pay

## 2019-02-23 DIAGNOSIS — Z20822 Contact with and (suspected) exposure to covid-19: Secondary | ICD-10-CM

## 2019-02-24 LAB — NOVEL CORONAVIRUS, NAA: SARS-CoV-2, NAA: NOT DETECTED

## 2019-02-25 ENCOUNTER — Ambulatory Visit: Payer: PRIVATE HEALTH INSURANCE | Admitting: Neurology

## 2019-03-08 HISTORY — PX: SPINAL FUSION: SHX223

## 2019-03-09 ENCOUNTER — Other Ambulatory Visit: Payer: Self-pay

## 2019-03-09 ENCOUNTER — Encounter (HOSPITAL_COMMUNITY): Payer: Self-pay

## 2019-03-09 ENCOUNTER — Encounter (HOSPITAL_COMMUNITY)
Admission: RE | Admit: 2019-03-09 | Discharge: 2019-03-09 | Disposition: A | Discharge status: Home / Self Care | Payer: Managed Care, Other (non HMO) | Source: Ambulatory Visit | Attending: Neurosurgery | Admitting: Neurosurgery

## 2019-03-09 DIAGNOSIS — Z7901 Long term (current) use of anticoagulants: Secondary | ICD-10-CM | POA: Insufficient documentation

## 2019-03-09 DIAGNOSIS — M4310 Spondylolisthesis, site unspecified: Secondary | ICD-10-CM | POA: Insufficient documentation

## 2019-03-09 DIAGNOSIS — I10 Essential (primary) hypertension: Secondary | ICD-10-CM | POA: Insufficient documentation

## 2019-03-09 DIAGNOSIS — F17210 Nicotine dependence, cigarettes, uncomplicated: Secondary | ICD-10-CM | POA: Diagnosis not present

## 2019-03-09 DIAGNOSIS — K219 Gastro-esophageal reflux disease without esophagitis: Secondary | ICD-10-CM | POA: Diagnosis not present

## 2019-03-09 DIAGNOSIS — Z01812 Encounter for preprocedural laboratory examination: Secondary | ICD-10-CM | POA: Insufficient documentation

## 2019-03-09 DIAGNOSIS — Z79899 Other long term (current) drug therapy: Secondary | ICD-10-CM | POA: Insufficient documentation

## 2019-03-09 DIAGNOSIS — Q8500 Neurofibromatosis, unspecified: Secondary | ICD-10-CM | POA: Insufficient documentation

## 2019-03-09 DIAGNOSIS — G4733 Obstructive sleep apnea (adult) (pediatric): Secondary | ICD-10-CM | POA: Diagnosis not present

## 2019-03-09 HISTORY — DX: Neurofibromatosis, unspecified: Q85.00

## 2019-03-09 HISTORY — DX: Gastro-esophageal reflux disease without esophagitis: K21.9

## 2019-03-09 HISTORY — DX: Sleep apnea, unspecified: G47.30

## 2019-03-09 LAB — SURGICAL PCR SCREEN
MRSA, PCR: NEGATIVE
Staphylococcus aureus: NEGATIVE

## 2019-03-09 LAB — CBC WITH DIFFERENTIAL/PLATELET
Abs Immature Granulocytes: 0.02 10*3/uL (ref 0.00–0.07)
Basophils Absolute: 0 10*3/uL (ref 0.0–0.1)
Basophils Relative: 1 %
Eosinophils Absolute: 0.2 10*3/uL (ref 0.0–0.5)
Eosinophils Relative: 3 %
HCT: 52.6 % — ABNORMAL HIGH (ref 39.0–52.0)
Hemoglobin: 17.8 g/dL — ABNORMAL HIGH (ref 13.0–17.0)
Immature Granulocytes: 0 %
Lymphocytes Relative: 35 %
Lymphs Abs: 1.9 10*3/uL (ref 0.7–4.0)
MCH: 29.1 pg (ref 26.0–34.0)
MCHC: 33.8 g/dL (ref 30.0–36.0)
MCV: 86.1 fL (ref 80.0–100.0)
Monocytes Absolute: 0.5 10*3/uL (ref 0.1–1.0)
Monocytes Relative: 9 %
Neutro Abs: 2.8 10*3/uL (ref 1.7–7.7)
Neutrophils Relative %: 52 %
Platelets: 245 10*3/uL (ref 150–400)
RBC: 6.11 MIL/uL — ABNORMAL HIGH (ref 4.22–5.81)
RDW: 13.2 % (ref 11.5–15.5)
WBC: 5.4 10*3/uL (ref 4.0–10.5)
nRBC: 0 % (ref 0.0–0.2)

## 2019-03-09 LAB — TYPE AND SCREEN
ABO/RH(D): B POS
Antibody Screen: NEGATIVE

## 2019-03-09 LAB — COMPREHENSIVE METABOLIC PANEL
ALT: 26 U/L (ref 0–44)
AST: 21 U/L (ref 15–41)
Albumin: 4.2 g/dL (ref 3.5–5.0)
Alkaline Phosphatase: 60 U/L (ref 38–126)
Anion gap: 10 (ref 5–15)
BUN: 11 mg/dL (ref 6–20)
CO2: 26 mmol/L (ref 22–32)
Calcium: 9.7 mg/dL (ref 8.9–10.3)
Chloride: 103 mmol/L (ref 98–111)
Creatinine, Ser: 1.07 mg/dL (ref 0.61–1.24)
GFR calc Af Amer: >60 mL/min (ref >60)
GFR calc non Af Amer: >60 mL/min (ref >60)
Glucose, Bld: 100 mg/dL — ABNORMAL HIGH (ref 70–99)
Potassium: 3.9 mmol/L (ref 3.5–5.1)
Sodium: 139 mmol/L (ref 135–145)
Total Bilirubin: 1.7 mg/dL — ABNORMAL HIGH (ref 0.3–1.2)
Total Protein: 6.9 g/dL (ref 6.5–8.1)

## 2019-03-09 LAB — ABO/RH: ABO/RH(D): B POS

## 2019-03-09 LAB — GLUCOSE, CAPILLARY: Glucose-Capillary: 108 mg/dL — ABNORMAL HIGH (ref 70–99)

## 2019-03-09 NOTE — Progress Notes (Signed)
PCP - [name], DO Cardiologist - denies  Chest x-ray - N/A EKG - 03/09/19 Stress Test - denies ECHO - denies Cardiac Cath - denies  Sleep Study - 09/2018 CPAP - doesn't have a CPAP yet; just moved to this area  Blood Thinner Instructions: N/A Aspirin Instructions: N/A  COVID TEST- scheduled 12/03; aware of need for self-quarantine  Anesthesia review: Yes; hx HTN, substance abuse, memory loss  Patient denies shortness of breath, fever, cough and chest pain at PAT appointment  All instructions explained to the patient, with a verbal understanding of the material. Patient agrees to go over the instructions while at home for a better understanding. Patient also instructed to self quarantine after being tested for COVID-19. The opportunity to ask questions was provided.

## 2019-03-09 NOTE — Anesthesia Preprocedure Evaluation (Addendum)
Anesthesia Evaluation  Patient identified by MRN, date of birth, ID band Patient awake    Reviewed: Allergy & Precautions, NPO status , Patient's Chart, lab work & pertinent test results  Airway Mallampati: III  TM Distance: >3 FB Neck ROM: Full    Dental  (+) Teeth Intact, Dental Advisory Given   Pulmonary Current Smoker,    breath sounds clear to auscultation       Cardiovascular hypertension,  Rhythm:Regular Rate:Normal     Neuro/Psych    GI/Hepatic   Endo/Other    Renal/GU      Musculoskeletal   Abdominal   Peds  Hematology   Anesthesia Other Findings   Reproductive/Obstetrics                            Anesthesia Physical Anesthesia Plan  ASA: III  Anesthesia Plan: General   Post-op Pain Management:    Induction: Intravenous  PONV Risk Score and Plan: Ondansetron and Dexamethasone  Airway Management Planned: Oral ETT  Additional Equipment:   Intra-op Plan:   Post-operative Plan: Extubation in OR  Informed Consent: I have reviewed the patients History and Physical, chart, labs and discussed the procedure including the risks, benefits and alternatives for the proposed anesthesia with the patient or authorized representative who has indicated his/her understanding and acceptance.     Dental advisory given  Plan Discussed with: CRNA and Anesthesiologist  Anesthesia Plan Comments: (PAT note written by Myra Gianotti, PA-C. )       Anesthesia Quick Evaluation

## 2019-03-09 NOTE — Progress Notes (Addendum)
Anesthesia Chart Review:  Case: [id number] Date/Time: 03/14/19 1110   Procedure: PLIF - L4-L5 (N/A Back)   Anesthesia type: General   Pre-op diagnosis: Spondylolisthesis   Location: [facility] OR ROOM 49 / [place] [facility]   Surgeon: Earnie Larsson, MD      DISCUSSION: Patient is a 53 year old male scheduled for the above procedure.  History includes smoking, HTN, moderate OSA (not set up with CPAP yet), GERD, neurofibromatosis, colonoscopy with polyp resection (x2, had post-procedure bleeding; denied any known hereditary bleeding disorder). Reported chronically abnormal EKG. BMI is consistent with obesity.  - Admission at [facility] in 02/2012 for chest pain, headaches, and arm tingling in setting of recent cocaine use. EKG showed slight ST elevation with lateral T wave inversion and BP 155/105. He underwent urgent LHC which showed normal coronaries with LAD bridging versus vasospasm. 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    - He was admitted to [place] in 06/2018 for inpatient treatment for substance abuse. Reportedly has been clean from cocaine since. Does smokes cigarettes and has an occasional beer.  I called and spoke with Mr. Lincoln. He confirms he is currently clean from cocaine. He has had chronic back pain since 2014, but is able to usual activities and has to go up a flight of stairs on a regular basis. No chest pain, SOB. No DOE with usual activities. No edema or syncope. Occasional very brief fluttering/palpitations. Reports chronically abnormal EKG dating back to around his 20's with cath in 2013 as described. He did report that his now decreased father had WPW, afib, and LV dysfunction with ICD (unsure of exact etiology, but says his father deteriorated following a VRE infection).     Preoperative EKG show NSR with inferolateral T wave abnormality. Received multiple comparison EKG tracings from [facility] from 02/27/12 and 02/28/12, all with T wave  abnormalities--although some more pronounced than others. He had normal coronaries with LAD bridging versus spasm at that time with cocaine cessation advised. Patient completed drug rehab earlier this year and reports he has refrained from cocaine. Had reviewed abnormal EKG and 2013 LHC results with anesthesiologist Tamela Gammon, MD. If patient remains asymptomatic from a CV standpoint and otherwise no acute changes then it is anticipated that he can proceed as planned.   COVID-19 test is scheduled for 03/10/19.   VS: BP (!) 162/92   Pulse 78   Temp 36.9 C (Oral)   Resp 18   Ht 6' (1.829 m)   Wt 115.3 kg   SpO2 98%   BMI 34.47 kg/m    PROVIDERS: Ronnald Nian, DO is PCP [facility]). Established care 07/28/18 after moving from [place], [place].  Star Age, MD is neurologist (for OSA)   LABS: Labs reviewed: Acceptable for surgery. Total bilirubin 1.7, but other AST/ALT, alk phos, and PLT count WNL. He denied abdominal pain, N/V. (all labs ordered are listed, but only abnormal results are displayed)  Labs Reviewed  GLUCOSE, CAPILLARY - Abnormal; Notable for the following components:      Result Value   Glucose-Capillary 108 (*)    All other components within normal limits  CBC WITH DIFFERENTIAL/PLATELET - Abnormal; Notable for the following components:   RBC 6.11 (*)    Hemoglobin 17.8 (*)    HCT 52.6 (*)    All other components within normal limits  COMPREHENSIVE METABOLIC PANEL - Abnormal; Notable for the following components:   Glucose, Bld 100 (*)  Total Bilirubin 1.7 (*)    All other components within normal limits  SURGICAL PCR SCREEN  TYPE AND SCREEN  ABO/RH    OTHER: Home Sleep Test 10/06/18: Summary/Recommendations:    This home sleep test demonstrates moderate obstructive sleep apnea with a total AHI of 29.3/hour and O2 nadir of 88%. Treatment with positive airway pressure (PAP) - in the form of CPAP - is recommended. This will require, ideally, a  full night CPAP titration study for proper treatment settings, O2 monitoring and mask fitting. Based on the severity of the sleep disordered breathing, an attended titration study is indicated. However, under the current circumstances (i.e. the COVID-19 pandemic), in order to ensure continuity of care and for the safety of the patient and healthcare professionals, he will be advised to proceed with an autoPAP titration/trial at home. A proper overnight, lab-attended PAP titration study with CPAP may be helpful or needed down the road to optimize treatment, when considered safe. Please note, that untreated obstructive sleep apnea may carry additional perioperative morbidity. Patients with significant obstructive sleep apnea should receive perioperative  PAP therapy and the surgeons and particularly the anesthesiologist should be informed of the diagnosis and the severity of the sleep disordered breathing. - As of 03/10/19, he has not gotten CPAP set up.   IMAGES: CT L-spine with myelogram 11/23/18: IMPRESSION: 1. Unchanged mild degenerative disc disease and advanced bilateral facet arthropathy at L4-L5 resulting in mild-to-moderate spinal canal stenosis with mild to moderate left and mild right neuroforaminal stenosis. No dynamic instability. 2. Numerous cauda equina nerve root nodules again identified, consistent with history of neurofibromatosis.  MRI Brain 10/22/18: IMPRESSION: - Normal MR imaging of the brain - Incompletely imaged round right upper lip soft tissue lesion measuring at least 12 mm. This lesion is not fully characterized on the current study, but may reflect a neurofibroma given patient's history. Correlate with findings on physical exam and consider targeted ultrasound, as warranted.   EKG: 03/09/19: Normal sinus rhythm Possible Left atrial enlargement T wave abnormality, consider inferolateral ischemia Abnormal ECG No previous tracing Confirmed by Kirk Ruths [phone])  on 03/09/2019 12:56:19 PM  EKGs from [facility] include: - 02/28/12:  SINUS RHYTHM LEFT VENTRICULAR HYPERTROPHY ST SEGMENT AND T WAVE CHANGES ANTEROSEPTAL AND LATERAL LEADS, ABNORMAL CHANGES POSSIBLY DUE TO MYOCARDIAL ISCHEMIA VS LVH WITH STRAIN  - 02/28/12: Sinus rhythm STs-T changes in the lateral leads Abnormal changes possibly due to myocardial ischemia  -02/27/12: Sinus rhythm Abnormally high RS ratio in V1 Right ventricular hypertrophy High voltage in limb leads Possible left ventricular hypertrophy Extensive ST-T changes These changes may be due to hypertrophy and/or ischemia  -02/27/12: Sinus rhythm Probable left atrial enlargement Abnormal R wave progression, early transition Abnormal T consider ischemia, diffuse leads Borderline ST elevation in anterior leads   CV: Cardiac cath 02/27/12 ([facility]): CONCLUSIONS 1.Angiographically normal coronaries aside from mid left anterior descending with either myocardial bridging versus coronary spasm. 2.Preserved left ventricular systolic function. Normal left ventricular systolic function with EF > 65%. LVEDP was around 10 mmHg. No gradient was seen on pullback across the aortic valve. 3.Normal left-sided filling pressures. RECOMMENDATIONS: I discussed findings with the patient. We again expressed to him the dire importance of cessation of cocaine use. Otherwise we will monitor him overnight and send him home tomorrow.   Past Medical History:  Diagnosis Date  . GERD (gastroesophageal reflux disease)   . History of kidney stones 2016  . Hypertension   . Neurofibromatosis (  [place])   . Sleep apnea     Past Surgical History:  Procedure Laterality Date  . CARDIAC CATHETERIZATION  02/27/2012   LHC [facility]): Normal coronaries aside from mLAD myocardial bridging verus coronary spasm, LV > 65%  . COLON SURGERY    . polyp removal      MEDICATIONS: . amLODipine (NORVASC) 10 MG tablet   . Cannabidiol POWD  . cetirizine (ZYRTEC) 10 MG tablet  . cyclobenzaprine (FLEXERIL) 10 MG tablet  . Diclofenac Sodium (PENNSAID) 2 % SOLN  . fluticasone (FLONASE) 50 MCG/ACT nasal spray  . ibuprofen (ADVIL) 800 MG tablet  . losartan-hydrochlorothiazide (HYZAAR) 100-25 MG tablet  . naproxen sodium (ALEVE) 220 MG tablet  . omeprazole (PRILOSEC) 20 MG capsule  . pregabalin (LYRICA) 75 MG capsule  . triamcinolone cream (KENALOG) 0.1 %   No current facility-administered medications for this encounter.   He has not consistently been taking antihypertensives due to temporarily misplacing his medications following his move. BP ~ 160/90 (without BP meds). Discussed that a significant elevated BP on the day of surgery could lead to case delay or cancellation. Reviewed that amlodipine could be taken on the morning of surgery with sips of water (if normally taken in the morning), but to hold losartan-HCTZ on the morning of surgery.   Myra Gianotti, PA-C Surgical Short Stay/Anesthesiology [facility] Phone [phone] [facility] Phone [phone] 03/10/2019 11:37 AM

## 2019-03-09 NOTE — Progress Notes (Signed)
[facility][id number] Marcus Armstrong, [place] [id number] [name] [phone] Phone: [phone] Fax: [phone]      Your procedure is scheduled on Monday, December 7th, 2020.   Report to [facility] Main Entrance "A" at 9:25 A.M., and check in at the Admitting office.   Call this number if you have problems the morning of surgery:  [phone]  Call [phone] if you have any questions prior to your surgery date Monday-Friday 8am-4pm    Remember:  Do not eat or drink after midnight the night before your surgery   Take these medicines the morning of surgery with A SIP OF WATER :  Amlodipine (Norvasc) Cetirizine (Zyrtec) Cyclobenzaprine (Flexeril) - if needed Fluticasone (Flonase) - if needed Omeprazole (Prilosec) Pregabalin (Lyrica) 7 days prior to surgery STOP taking any Aspirin (unless otherwise instructed by your surgeon), Aleve, Naproxen, Ibuprofen, Motrin, Advil, Goody's, BC's, all herbal medications, fish oil, and all vitamins.    The Morning of Surgery  Do not wear jewelry, make-up or nail polish.  Do not wear lotions, powders, or perfumes/colognes, or deodorant  Do not shave 48 hours prior to surgery.  Men may shave face and neck.  Do not bring valuables to the hospital.  [facility] is not responsible for any belongings or valuables.  If you are a smoker, DO NOT Smoke 24 hours prior to surgery  If you wear a CPAP at night please bring your mask, tubing, and machine the morning of surgery   Remember that you must have someone to transport you home after your surgery, and remain with you for 24 hours if you are discharged the same day.   Please bring cases for contacts, glasses, hearing aids, dentures or bridgework because it cannot be worn into surgery.    Leave your suitcase in the car.  After surgery it may be brought to your room.  For patients admitted to the hospital, discharge time  will be determined by your treatment team.  Patients discharged the day of surgery will not be allowed to drive home.    Special instructions:   [facility]- Preparing For Surgery  Before surgery, you can play an important role. Because skin is not sterile, your skin needs to be as free of germs as possible. You can reduce the number of germs on your skin by washing with CHG (chlorahexidine gluconate) Soap before surgery.  CHG is an antiseptic cleaner which kills germs and bonds with the skin to continue killing germs even after washing.    Oral Hygiene is also important to reduce your risk of infection.  Remember - BRUSH YOUR TEETH THE MORNING OF SURGERY WITH YOUR REGULAR TOOTHPASTE  Please do not use if you have an allergy to CHG or antibacterial soaps. If your skin becomes reddened/irritated stop using the CHG.  Do not shave (including legs and underarms) for at least 48 hours prior to first CHG shower. It is OK to shave your face.  Please follow these instructions carefully.   1. Shower the NIGHT BEFORE SURGERY and the MORNING OF SURGERY with CHG Soap.   2. If you chose to wash your hair, wash your hair first as usual with your normal shampoo.  3. After you shampoo, rinse your hair and body thoroughly to remove the shampoo.  4. Use CHG as you would any other liquid soap. You can apply CHG directly to the skin and wash gently with a  scrungie or a clean washcloth.   5. Apply the CHG Soap to your body ONLY FROM THE NECK DOWN.  Do not use on open wounds or open sores. Avoid contact with your eyes, ears, mouth and genitals (private parts). Wash Face and genitals (private parts)  with your normal soap.   6. Wash thoroughly, paying special attention to the area where your surgery will be performed.  7. Thoroughly rinse your body with warm water from the neck down.  8. DO NOT shower/wash with your normal soap after using and rinsing off the CHG Soap.  9. Pat yourself dry with a CLEAN  TOWEL.  10. Wear CLEAN PAJAMAS to bed the night before surgery, wear comfortable clothes the morning of surgery  11. Place CLEAN SHEETS on your bed the night of your first shower and DO NOT SLEEP WITH PETS.    Day of Surgery:  Please shower the morning of surgery with the CHG soap Do not apply any deodorants/lotions. Please wear clean clothes to the hospital/surgery center.   Remember to brush your teeth WITH YOUR REGULAR TOOTHPASTE.   Please read over the following fact sheets that you were given.

## 2019-03-10 ENCOUNTER — Encounter (HOSPITAL_COMMUNITY): Payer: Self-pay

## 2019-03-10 ENCOUNTER — Other Ambulatory Visit (HOSPITAL_COMMUNITY)
Admission: RE | Admit: 2019-03-10 | Discharge: 2019-03-10 | Disposition: A | Discharge status: Home / Self Care | Payer: Managed Care, Other (non HMO) | Source: Ambulatory Visit | Attending: Neurosurgery | Admitting: Neurosurgery

## 2019-03-10 DIAGNOSIS — Z20828 Contact with and (suspected) exposure to other viral communicable diseases: Secondary | ICD-10-CM | POA: Insufficient documentation

## 2019-03-10 DIAGNOSIS — Z01812 Encounter for preprocedural laboratory examination: Secondary | ICD-10-CM | POA: Diagnosis present

## 2019-03-12 ENCOUNTER — Encounter: Payer: Self-pay | Admitting: Family Medicine

## 2019-03-13 LAB — NOVEL CORONAVIRUS, NAA (HOSP ORDER, SEND-OUT TO REF LAB; TAT 18-24 HRS): SARS-CoV-2, NAA: NOT DETECTED

## 2019-03-14 ENCOUNTER — Inpatient Hospital Stay (HOSPITAL_COMMUNITY): Payer: Managed Care, Other (non HMO) | Admitting: Vascular Surgery

## 2019-03-14 ENCOUNTER — Encounter (HOSPITAL_COMMUNITY): Payer: Self-pay | Admitting: Certified Registered"

## 2019-03-14 ENCOUNTER — Inpatient Hospital Stay (HOSPITAL_COMMUNITY)
Admission: RE | Admit: 2019-03-14 | Discharge: 2019-03-16 | DRG: 455 | Disposition: A | Discharge status: Home / Self Care | Payer: Managed Care, Other (non HMO) | Attending: Neurosurgery | Admitting: Neurosurgery

## 2019-03-14 ENCOUNTER — Inpatient Hospital Stay (HOSPITAL_COMMUNITY): Payer: Managed Care, Other (non HMO) | Admitting: Certified Registered"

## 2019-03-14 ENCOUNTER — Inpatient Hospital Stay (HOSPITAL_COMMUNITY): Payer: Managed Care, Other (non HMO)

## 2019-03-14 ENCOUNTER — Other Ambulatory Visit: Payer: Self-pay

## 2019-03-14 ENCOUNTER — Encounter (HOSPITAL_COMMUNITY)
Admission: RE | Disposition: A | Discharge status: Home / Self Care | Payer: Self-pay | Source: Home / Self Care | Attending: Neurosurgery

## 2019-03-14 DIAGNOSIS — M47816 Spondylosis without myelopathy or radiculopathy, lumbar region: Secondary | ICD-10-CM | POA: Diagnosis present

## 2019-03-14 DIAGNOSIS — G4730 Sleep apnea, unspecified: Secondary | ICD-10-CM | POA: Diagnosis present

## 2019-03-14 DIAGNOSIS — Z87442 Personal history of urinary calculi: Secondary | ICD-10-CM

## 2019-03-14 DIAGNOSIS — Z20828 Contact with and (suspected) exposure to other viral communicable diseases: Secondary | ICD-10-CM | POA: Diagnosis present

## 2019-03-14 DIAGNOSIS — Q8500 Neurofibromatosis, unspecified: Secondary | ICD-10-CM | POA: Diagnosis not present

## 2019-03-14 DIAGNOSIS — M4310 Spondylolisthesis, site unspecified: Secondary | ICD-10-CM | POA: Diagnosis present

## 2019-03-14 DIAGNOSIS — I10 Essential (primary) hypertension: Secondary | ICD-10-CM | POA: Diagnosis present

## 2019-03-14 DIAGNOSIS — Z791 Long term (current) use of non-steroidal anti-inflammatories (NSAID): Secondary | ICD-10-CM | POA: Diagnosis not present

## 2019-03-14 DIAGNOSIS — Z91013 Allergy to seafood: Secondary | ICD-10-CM

## 2019-03-14 DIAGNOSIS — F17210 Nicotine dependence, cigarettes, uncomplicated: Secondary | ICD-10-CM | POA: Diagnosis present

## 2019-03-14 DIAGNOSIS — M48061 Spinal stenosis, lumbar region without neurogenic claudication: Principal | ICD-10-CM | POA: Diagnosis present

## 2019-03-14 DIAGNOSIS — Z79899 Other long term (current) drug therapy: Secondary | ICD-10-CM | POA: Diagnosis not present

## 2019-03-14 DIAGNOSIS — K219 Gastro-esophageal reflux disease without esophagitis: Secondary | ICD-10-CM | POA: Diagnosis present

## 2019-03-14 DIAGNOSIS — Z419 Encounter for procedure for purposes other than remedying health state, unspecified: Secondary | ICD-10-CM

## 2019-03-14 LAB — GLUCOSE, CAPILLARY: Glucose-Capillary: 100 mg/dL — ABNORMAL HIGH (ref 70–99)

## 2019-03-14 SURGERY — POSTERIOR LUMBAR FUSION 1 LEVEL
Anesthesia: General | Site: Back

## 2019-03-14 MED ORDER — SODIUM CHLORIDE 0.9% FLUSH: 3.0000 mL | INTRAVENOUS | Status: DC | PRN

## 2019-03-14 MED ORDER — VANCOMYCIN HCL 1000 MG IV SOLR
INTRAVENOUS | Status: AC
  Filled 2019-03-14: qty 1000

## 2019-03-14 MED ORDER — SUGAMMADEX SODIUM 200 MG/2ML IV SOLN
INTRAVENOUS | Status: DC | PRN
  Administered 2019-03-14: 230 mg via INTRAVENOUS

## 2019-03-14 MED ORDER — SUCCINYLCHOLINE CHLORIDE 200 MG/10ML IV SOSY
PREFILLED_SYRINGE | INTRAVENOUS | Status: AC
  Filled 2019-03-14: qty 10

## 2019-03-14 MED ORDER — PANTOPRAZOLE SODIUM 40 MG PO TBEC
80.0000 mg | DELAYED_RELEASE_TABLET | Freq: Every day | ORAL | Status: DC
  Administered 2019-03-14 – 2019-03-15 (×2): 80 mg via ORAL
  Filled 2019-03-14 (×2): qty 2

## 2019-03-14 MED ORDER — LACTATED RINGERS IV SOLN
INTRAVENOUS | Status: DC
  Administered 2019-03-14 (×2): via INTRAVENOUS

## 2019-03-14 MED ORDER — THROMBIN 20000 UNITS EX SOLR
CUTANEOUS | Status: AC
  Filled 2019-03-14: qty 20000

## 2019-03-14 MED ORDER — ONDANSETRON HCL 4 MG PO TABS: 4.0000 mg | ORAL_TABLET | Freq: Four times a day (QID) | ORAL | Status: DC | PRN

## 2019-03-14 MED ORDER — PHENOL 1.4 % MT LIQD: 1.0000 | OROMUCOSAL | Status: DC | PRN

## 2019-03-14 MED ORDER — VANCOMYCIN HCL POWD
Status: DC | PRN
  Administered 2019-03-14: 12:00:00 100 mg via SURGICAL_CAVITY

## 2019-03-14 MED ORDER — ROCURONIUM BROMIDE 10 MG/ML (PF) SYRINGE
PREFILLED_SYRINGE | INTRAVENOUS | Status: DC | PRN
  Administered 2019-03-14 (×3): 20 mg via INTRAVENOUS
  Administered 2019-03-14: 60 mg via INTRAVENOUS
  Administered 2019-03-14: 10 mg via INTRAVENOUS

## 2019-03-14 MED ORDER — CHLORHEXIDINE GLUCONATE CLOTH 2 % EX PADS: 6.0000 | MEDICATED_PAD | Freq: Once | CUTANEOUS | Status: DC

## 2019-03-14 MED ORDER — FENTANYL CITRATE (PF) 100 MCG/2ML IJ SOLN
INTRAMUSCULAR | Status: AC
  Administered 2019-03-14: 50 ug
  Filled 2019-03-14: qty 2

## 2019-03-14 MED ORDER — PROPOFOL 10 MG/ML IV BOLUS
INTRAVENOUS | Status: DC | PRN
  Administered 2019-03-14: 200 mg via INTRAVENOUS
  Administered 2019-03-14: 30 mg via INTRAVENOUS

## 2019-03-14 MED ORDER — BISACODYL 10 MG RE SUPP: 10.0000 mg | Freq: Every day | RECTAL | Status: DC | PRN

## 2019-03-14 MED ORDER — THROMBIN 20000 UNITS EX SOLR
CUTANEOUS | Status: DC | PRN
  Administered 2019-03-14: 20 mL

## 2019-03-14 MED ORDER — SODIUM CHLORIDE 0.9 % IV SOLN
INTRAVENOUS | Status: DC | PRN
  Administered 2019-03-14: 12:00:00 500 mL

## 2019-03-14 MED ORDER — FENTANYL CITRATE (PF) 100 MCG/2ML IJ SOLN
INTRAMUSCULAR | Status: DC | PRN
  Administered 2019-03-14 (×2): 50 ug via INTRAVENOUS
  Administered 2019-03-14: 100 ug via INTRAVENOUS
  Administered 2019-03-14: 50 ug via INTRAVENOUS

## 2019-03-14 MED ORDER — PROPOFOL 10 MG/ML IV BOLUS
INTRAVENOUS | Status: AC
  Filled 2019-03-14: qty 20

## 2019-03-14 MED ORDER — BUPIVACAINE HCL (PF) 0.25 % IJ SOLN
INTRAMUSCULAR | Status: DC | PRN
  Administered 2019-03-14: 20 mL

## 2019-03-14 MED ORDER — FENTANYL CITRATE (PF) 100 MCG/2ML IJ SOLN
25.0000 ug | Freq: Once | INTRAMUSCULAR | Status: AC
  Administered 2019-03-14: 50 ug via INTRAVENOUS

## 2019-03-14 MED ORDER — ACETAMINOPHEN 650 MG RE SUPP: 650.0000 mg | RECTAL | Status: DC | PRN

## 2019-03-14 MED ORDER — FLEET ENEMA 7-19 GM/118ML RE ENEM: 1.0000 | ENEMA | Freq: Once | RECTAL | Status: DC | PRN

## 2019-03-14 MED ORDER — 0.9 % SODIUM CHLORIDE (POUR BTL) OPTIME
TOPICAL | Status: DC | PRN
  Administered 2019-03-14: 12:00:00 1000 mL

## 2019-03-14 MED ORDER — DEXAMETHASONE SODIUM PHOSPHATE 10 MG/ML IJ SOLN
INTRAMUSCULAR | Status: DC | PRN
  Administered 2019-03-14: 10 mg via INTRAVENOUS

## 2019-03-14 MED ORDER — ROCURONIUM BROMIDE 10 MG/ML (PF) SYRINGE
PREFILLED_SYRINGE | INTRAVENOUS | Status: AC
  Filled 2019-03-14: qty 20

## 2019-03-14 MED ORDER — OXYCODONE HCL 5 MG PO TABS
10.0000 mg | ORAL_TABLET | ORAL | Status: DC | PRN
  Administered 2019-03-14 – 2019-03-16 (×10): 10 mg via ORAL
  Filled 2019-03-14 (×10): qty 2

## 2019-03-14 MED ORDER — PREGABALIN 75 MG PO CAPS
75.0000 mg | ORAL_CAPSULE | Freq: Two times a day (BID) | ORAL | Status: DC
  Administered 2019-03-14 – 2019-03-15 (×4): 75 mg via ORAL
  Filled 2019-03-14 (×4): qty 1

## 2019-03-14 MED ORDER — SODIUM CHLORIDE 0.9% FLUSH
3.0000 mL | Freq: Two times a day (BID) | INTRAVENOUS | Status: DC
  Administered 2019-03-14 – 2019-03-15 (×2): 3 mL via INTRAVENOUS

## 2019-03-14 MED ORDER — MIDAZOLAM HCL 2 MG/2ML IJ SOLN
INTRAMUSCULAR | Status: AC
  Filled 2019-03-14: qty 2

## 2019-03-14 MED ORDER — SODIUM CHLORIDE 0.9 % IV SOLN: 250.0000 mL | INTRAVENOUS | Status: DC

## 2019-03-14 MED ORDER — ONDANSETRON HCL 4 MG/2ML IJ SOLN
INTRAMUSCULAR | Status: AC
  Filled 2019-03-14: qty 2

## 2019-03-14 MED ORDER — ACETAMINOPHEN 325 MG PO TABS: 650.0000 mg | ORAL_TABLET | ORAL | Status: DC | PRN

## 2019-03-14 MED ORDER — HYDROMORPHONE HCL 1 MG/ML IJ SOLN
1.0000 mg | INTRAMUSCULAR | Status: DC | PRN
  Administered 2019-03-14 (×2): 1 mg via INTRAVENOUS
  Filled 2019-03-14 (×2): qty 1

## 2019-03-14 MED ORDER — LIDOCAINE 2% (20 MG/ML) 5 ML SYRINGE
INTRAMUSCULAR | Status: AC
  Filled 2019-03-14: qty 5

## 2019-03-14 MED ORDER — DEXAMETHASONE SODIUM PHOSPHATE 10 MG/ML IJ SOLN
INTRAMUSCULAR | Status: AC
  Filled 2019-03-14: qty 1

## 2019-03-14 MED ORDER — CEFAZOLIN SODIUM-DEXTROSE 1-4 GM/50ML-% IV SOLN
1.0000 g | Freq: Three times a day (TID) | INTRAVENOUS | Status: AC
  Administered 2019-03-14 (×2): 1 g via INTRAVENOUS
  Filled 2019-03-14 (×2): qty 50

## 2019-03-14 MED ORDER — DIAZEPAM 5 MG PO TABS
5.0000 mg | ORAL_TABLET | Freq: Four times a day (QID) | ORAL | Status: DC | PRN
  Administered 2019-03-14 – 2019-03-15 (×3): 10 mg via ORAL
  Administered 2019-03-15: 5 mg via ORAL
  Administered 2019-03-15: 10 mg via ORAL
  Administered 2019-03-16: 5 mg via ORAL
  Filled 2019-03-14: qty 2
  Filled 2019-03-14: qty 1
  Filled 2019-03-14 (×2): qty 2
  Filled 2019-03-14 (×2): qty 1
  Filled 2019-03-14: qty 2

## 2019-03-14 MED ORDER — LIDOCAINE 2% (20 MG/ML) 5 ML SYRINGE
INTRAMUSCULAR | Status: DC | PRN
  Administered 2019-03-14: 40 mg via INTRAVENOUS

## 2019-03-14 MED ORDER — ESMOLOL HCL 100 MG/10ML IV SOLN
INTRAVENOUS | Status: AC
  Filled 2019-03-14: qty 10

## 2019-03-14 MED ORDER — HYDROCODONE-ACETAMINOPHEN 10-325 MG PO TABS: 1.0000 | ORAL_TABLET | ORAL | Status: DC | PRN

## 2019-03-14 MED ORDER — POLYETHYLENE GLYCOL 3350 17 G PO PACK: 17.0000 g | PACK | Freq: Every day | ORAL | Status: DC | PRN

## 2019-03-14 MED ORDER — MENTHOL 3 MG MT LOZG: 1.0000 | LOZENGE | OROMUCOSAL | Status: DC | PRN

## 2019-03-14 MED ORDER — MIDAZOLAM HCL 5 MG/5ML IJ SOLN
INTRAMUSCULAR | Status: DC | PRN
  Administered 2019-03-14: 2 mg via INTRAVENOUS

## 2019-03-14 MED ORDER — LORATADINE 10 MG PO TABS
10.0000 mg | ORAL_TABLET | Freq: Every day | ORAL | Status: DC
  Administered 2019-03-14 – 2019-03-15 (×2): 10 mg via ORAL
  Filled 2019-03-14 (×2): qty 1

## 2019-03-14 MED ORDER — ONDANSETRON HCL 4 MG/2ML IJ SOLN: 4.0000 mg | Freq: Four times a day (QID) | INTRAMUSCULAR | Status: DC | PRN

## 2019-03-14 MED ORDER — BUPIVACAINE HCL (PF) 0.25 % IJ SOLN
INTRAMUSCULAR | Status: AC
  Filled 2019-03-14: qty 30

## 2019-03-14 MED ORDER — FLUTICASONE PROPIONATE 50 MCG/ACT NA SUSP
1.0000 | Freq: Every day | NASAL | Status: DC | PRN
  Filled 2019-03-14: qty 16

## 2019-03-14 MED ORDER — PHENYLEPHRINE 40 MCG/ML (10ML) SYRINGE FOR IV PUSH (FOR BLOOD PRESSURE SUPPORT)
PREFILLED_SYRINGE | INTRAVENOUS | Status: DC | PRN
  Administered 2019-03-14: 120 ug via INTRAVENOUS
  Administered 2019-03-14: 40 ug via INTRAVENOUS
  Administered 2019-03-14: 80 ug via INTRAVENOUS

## 2019-03-14 MED ORDER — CEFAZOLIN SODIUM-DEXTROSE 2-4 GM/100ML-% IV SOLN
2.0000 g | INTRAVENOUS | Status: AC
  Administered 2019-03-14: 2 g via INTRAVENOUS
  Filled 2019-03-14: qty 100

## 2019-03-14 MED ORDER — FENTANYL CITRATE (PF) 250 MCG/5ML IJ SOLN
INTRAMUSCULAR | Status: AC
  Filled 2019-03-14: qty 5

## 2019-03-14 MED ORDER — DEXMEDETOMIDINE HCL IN NACL 200 MCG/50ML IV SOLN
INTRAVENOUS | Status: DC | PRN
  Administered 2019-03-14: 8 ug via INTRAVENOUS
  Administered 2019-03-14: 12 ug via INTRAVENOUS

## 2019-03-14 MED ORDER — ONDANSETRON HCL 4 MG/2ML IJ SOLN
INTRAMUSCULAR | Status: DC | PRN
  Administered 2019-03-14: 4 mg via INTRAVENOUS

## 2019-03-14 MED ORDER — PHENYLEPHRINE HCL-NACL 10-0.9 MG/250ML-% IV SOLN
INTRAVENOUS | Status: DC | PRN
  Administered 2019-03-14: 25 ug/min via INTRAVENOUS

## 2019-03-14 MED ORDER — DEXAMETHASONE SODIUM PHOSPHATE 10 MG/ML IJ SOLN
10.0000 mg | Freq: Once | INTRAMUSCULAR | Status: DC
  Filled 2019-03-14: qty 1

## 2019-03-14 MED FILL — Vancomycin HCl For IV Soln 1 GM (Base Equivalent): INTRAVENOUS | Qty: 1000 | Status: AC

## 2019-03-14 SURGICAL SUPPLY — 56 items
BAG DECANTER FOR FLEXI CONT (MISCELLANEOUS) ×2 IMPLANT
BENZOIN TINCTURE PRP APPL 2/3 (GAUZE/BANDAGES/DRESSINGS) ×2 IMPLANT
BLADE CLIPPER SURG (BLADE) IMPLANT
BUR CUTTER 7.0 ROUND (BURR) IMPLANT
BUR MATCHSTICK NEURO 3.0 LAGG (BURR) ×2 IMPLANT
CANISTER SUCT 3000ML PPV (MISCELLANEOUS) ×2 IMPLANT
CAP LCK SPNE (Orthopedic Implant) ×4 IMPLANT
CAP LOCK SPINE RADIUS (Orthopedic Implant) ×4 IMPLANT
CAP LOCKING (Orthopedic Implant) ×4 IMPLANT
CARTRIDGE OIL MAESTRO DRILL (MISCELLANEOUS) ×1 IMPLANT
CONT SPEC 4OZ CLIKSEAL STRL BL (MISCELLANEOUS) ×2 IMPLANT
COVER BACK TABLE 60X90IN (DRAPES) ×2 IMPLANT
DECANTER SPIKE VIAL GLASS SM (MISCELLANEOUS) ×2 IMPLANT
DERMABOND ADVANCED (GAUZE/BANDAGES/DRESSINGS) ×1
DERMABOND ADVANCED .7 DNX12 (GAUZE/BANDAGES/DRESSINGS) ×1 IMPLANT
DEVICE INTERBODY ELEVATE 10X23 (Cage) ×4 IMPLANT
DIFFUSER DRILL AIR PNEUMATIC (MISCELLANEOUS) ×2 IMPLANT
DRAPE C-ARM 42X72 X-RAY (DRAPES) ×4 IMPLANT
DRAPE HALF SHEET 40X57 (DRAPES) IMPLANT
DRAPE LAPAROTOMY 100X72X124 (DRAPES) ×2 IMPLANT
DRAPE SURG 17X23 STRL (DRAPES) ×8 IMPLANT
DRSG OPSITE POSTOP 4X6 (GAUZE/BANDAGES/DRESSINGS) ×2 IMPLANT
DURAPREP 26ML APPLICATOR (WOUND CARE) ×2 IMPLANT
ELECT REM PT RETURN 9FT ADLT (ELECTROSURGICAL) ×2
ELECTRODE REM PT RTRN 9FT ADLT (ELECTROSURGICAL) ×1 IMPLANT
EVACUATOR 1/8 PVC DRAIN (DRAIN) IMPLANT
GAUZE 4X4 16PLY RFD (DISPOSABLE) IMPLANT
GAUZE SPONGE 4X4 12PLY STRL (GAUZE/BANDAGES/DRESSINGS) IMPLANT
GLOVE BIO SURGEON STRL SZ 6.5 (GLOVE) ×2 IMPLANT
GLOVE BIOGEL PI IND STRL 6.5 (GLOVE) ×1 IMPLANT
GLOVE BIOGEL PI INDICATOR 6.5 (GLOVE) ×1
GLOVE ECLIPSE 9.0 STRL (GLOVE) ×4 IMPLANT
GOWN STRL REUS W/ TWL LRG LVL3 (GOWN DISPOSABLE) IMPLANT
GOWN STRL REUS W/ TWL XL LVL3 (GOWN DISPOSABLE) ×2 IMPLANT
GOWN STRL REUS W/TWL 2XL LVL3 (GOWN DISPOSABLE) IMPLANT
GOWN STRL REUS W/TWL LRG LVL3 (GOWN DISPOSABLE)
GOWN STRL REUS W/TWL XL LVL3 (GOWN DISPOSABLE) ×2
KIT BASIN OR (CUSTOM PROCEDURE TRAY) ×2 IMPLANT
KIT TURNOVER KIT B (KITS) ×2 IMPLANT
MILL MEDIUM DISP (BLADE) ×2 IMPLANT
NEEDLE HYPO 22GX1.5 SAFETY (NEEDLE) ×2 IMPLANT
NS IRRIG 1000ML POUR BTL (IV SOLUTION) ×2 IMPLANT
OIL CARTRIDGE MAESTRO DRILL (MISCELLANEOUS) ×2
PACK LAMINECTOMY NEURO (CUSTOM PROCEDURE TRAY) ×2 IMPLANT
ROD RADIUS 35MM (Rod) ×4 IMPLANT
SCREW 5.75X45MM (Screw) ×8 IMPLANT
SPONGE SURGIFOAM ABS GEL 100 (HEMOSTASIS) ×2 IMPLANT
STRIP CLOSURE SKIN 1/2X4 (GAUZE/BANDAGES/DRESSINGS) ×2 IMPLANT
SUT VIC AB 0 CT1 18XCR BRD8 (SUTURE) ×2 IMPLANT
SUT VIC AB 0 CT1 8-18 (SUTURE) ×2
SUT VIC AB 2-0 CT1 18 (SUTURE) ×2 IMPLANT
SUT VIC AB 3-0 SH 8-18 (SUTURE) ×4 IMPLANT
TOWEL GREEN STERILE (TOWEL DISPOSABLE) ×2 IMPLANT
TOWEL GREEN STERILE FF (TOWEL DISPOSABLE) ×2 IMPLANT
TRAY FOLEY MTR SLVR 16FR STAT (SET/KITS/TRAYS/PACK) ×2 IMPLANT
WATER STERILE IRR 1000ML POUR (IV SOLUTION) ×2 IMPLANT

## 2019-03-14 NOTE — Transfer of Care (Signed)
Immediate Anesthesia Transfer of Care Note  Patient: Marcus Armstrong  Procedure(s) Performed: Posterior Lumbar Interbody Fusion - Lumbar Four-Lumbar Five (N/A Back)  Patient Location: PACU  Anesthesia Type:General  Level of Consciousness: drowsy  Airway & Oxygen Therapy: Patient Spontanous Breathing and Patient connected to face mask oxygen  Post-op Assessment: Report given to RN and Post -op Vital signs reviewed and stable  Post vital signs: Reviewed and stable  Last Vitals:  Vitals Value Taken Time  BP 109/67 03/14/19 1359  Temp    Pulse 67 03/14/19 1400  Resp 14 03/14/19 1400  SpO2 96 % 03/14/19 1400  Vitals shown include unvalidated device data.  Last Pain:  Vitals:   03/14/19 1358  TempSrc:   PainSc: 0-No pain         Complications: No apparent anesthesia complications

## 2019-03-14 NOTE — Op Note (Signed)
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  L4-5 posterior lumbar interbody fusion utilizing interbody cages and locally harvested autograft  L4-5 posterior lateral arthrodesis utilizing nonsegmental pedicle screw fixation and local autograft  Surgeon:Brier Firebaugh A.Kailand Seda, M.D.  Asst. Surgeon: Reinaldo Meeker, NP  Anesthesia: General  Indication: 53 year old male with back and severe right lower extremity pain failing conservative management.  Patient with known neurofibromatosis however pain is felt to be secondary to his severe spondylosis with marked facet arthropathy and severe lateral recess and neural foraminal stenosis right greater than left at L4-5.  Patient presents now for decompression and fusion in hopes of improving his symptoms.  Operative note: After induction anesthesia, patient position prone onto Wilson frame and appropriate padded.  Lumbar region prepped and draped sterilely.  Incision made overlying L4-5.  Dissection performed bilaterally.  Retractor placed.  Fluoroscopy used.  Levels confirmed.  Decompressive laminotomies and foraminotomies were then performed using Leksell rongeurs and Kerrison rongeurs to remove the inferior two thirds of the lamina of L4 the entire inferior facet of L4 bilaterally the superior facet of L5 was also resected bilaterally and the superior aspect of the L5 lamina was resected.  Ligament flavum elevated and resected.  Underlying thecal sac was identified.  Foraminotomies completed on the course exiting L4 and L5 nerve roots bilaterally.  Bilateral discectomies then performed.  Dissipates were  then prepared for interbody fusion.  With a distractor placed the patient's right side to space was then scraped cleaned of soft tissue.  A 10 mm [employer] expandable cage packed with locally harvested autograft was then packed into place and expanded to its full extent.  Distractor removed patient's right side.  The space prepared on the right side.  Morselized autograft then packed in the interspace.  Second cage packed with autograft was then impacted in the place and expanded to its full extent.  Pedicles at L4 and L5 were identified using surface landmarks and intraoperative fluoroscopy with superficial bone around the pedicle and then removed using high-speed drill.  Pedicle was then probed using pedicle all each pedicle tract was then probed and found to be solid within the bone.  Each pedicle tract was then tapped with a screw tap.  Screw temple was probed and found to be solid within the bone.  5.75 mm radius brand screws from [employer] placed bilaterally at L4 and L5.  Final images reveal good position of the cages and the hardware at the proper upper level with normal alignment of spine.  Wound is then irrigated one final time.  Morselized autograft was packed posterior laterally.  Short segment titanium rod placed over the screws at L4 and L5.  Locking caps placed over the screws.  Locking caps then engaged with a screw with a construct under mild compression.  Gelfoam was placed over the laminotomy defects.  Vancomycin powder was placed in deep wound space.  Wounds and closed in layers with Vicryl sutures.  Steri-Strips and sterile dressing were applied.  No apparent complications.  Patient tolerated the procedure well and he returned to the recovery room postop.

## 2019-03-14 NOTE — Anesthesia Postprocedure Evaluation (Signed)
Anesthesia Post Note  Patient: [name]  Procedure(s) Performed: Posterior Lumbar Interbody Fusion - Lumbar Four-Lumbar Five (N/A Back)     Patient location during evaluation: PACU Anesthesia Type: General Level of consciousness: awake and alert Pain management: pain level controlled Vital Signs Assessment: post-procedure vital signs reviewed and stable Respiratory status: spontaneous breathing, nonlabored ventilation, respiratory function stable and patient connected to nasal cannula oxygen Cardiovascular status: blood pressure returned to baseline and stable Postop Assessment: no apparent nausea or vomiting Anesthetic complications: no    Last Vitals:  Vitals:   03/14/19 1506 03/14/19 1727  BP: 118/72 127/75  Pulse: 70 91  Resp: 20 16  Temp: 36.7 C 36.8 C  SpO2: 99% 99%    Last Pain:  Vitals:   03/14/19 1805  TempSrc:   PainSc: 8                  Stela Iwasaki COKER

## 2019-03-14 NOTE — H&P (Signed)
Marcus Armstrong is an 53 y.o. male.   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  Patient presents with severe back and right lower extremity pain which is failed conservative management.  Work-up demonstrates evidence of degenerative spondylolisthesis with significant facet arthropathy and severe lateral recess and neural foraminal stenosis on the right at L4-5.  The patient's intrathecal tumors are all stable.  We discussed options  for treatment.  Patient wishes to move forward with surgical decompression and fusion in hopes of improving his symptoms.  Past Medical History:  Diagnosis Date  . GERD (gastroesophageal reflux disease)   . History of kidney stones 2016  . Hypertension   . Neurofibromatosis ([place])   . Sleep apnea     Past Surgical History:  Procedure Laterality Date  . CARDIAC CATHETERIZATION  02/27/2012   [facility]): Normal coronaries aside from mLAD myocardial bridging verus coronary spasm, LV > 65%  . COLON SURGERY     colonoscpy with polyp resection  . polyp removal      Family History  Problem Relation Age of Onset  . Cancer Father 86       prostate   Social History:  reports that he has been smoking cigarettes. He has a 7.50 pack-year smoking history. He has never used smokeless tobacco. He reports current alcohol use. He reports previous drug use. Drug: "Crack" cocaine.  Allergies:  Allergies  Allergen Reactions  . Cat Hair Extract Hives, Shortness Of Breath, Swelling, Rash and Other (See Comments)  . Shellfish Allergy Shortness Of Breath, Itching and Swelling    Eye ball swelling (when not eating fresh seafood, breathing problems)    Medications Prior to Admission  Medication Sig Dispense Refill  . amLODipine (NORVASC) 10 MG tablet Take 1 tablet (10 mg total) by mouth daily. 90 tablet 3  . Cannabidiol POWD Apply 1 application  topically 3 (three) times daily as needed (pain.). CBD Pain Relief Roll-O    . cetirizine (ZYRTEC) 10 MG tablet Take 10 mg by mouth every other day.     . cyclobenzaprine (FLEXERIL) 10 MG tablet Take 1 tablet (10 mg total) by mouth 3 (three) times daily as needed for muscle spasms. 60 tablet 0  . Diclofenac Sodium (PENNSAID) 2 % SOLN Place 1 application onto the skin 2 (two) times daily. (Patient taking differently: Place 1 application onto the skin 2 (two) times daily as needed (pain.). ) 1 Bottle 3  . fluticasone (FLONASE) 50 MCG/ACT nasal spray Place 1-2 sprays into both nostrils daily as needed (allergies.).     Marland Kitchen ibuprofen (ADVIL) 800 MG tablet Take 1 tablet (800 mg total) by mouth every 8 (eight) hours as needed. (Patient taking differently: Take 800 mg by mouth every 8 (eight) hours as needed (pain). ) 90 tablet 1  . losartan-hydrochlorothiazide (HYZAAR) 100-25 MG tablet Take 1 tablet by mouth daily. 90 tablet 3  . naproxen sodium (ALEVE) 220 MG tablet Take 440 mg by mouth 3 (three) times daily as needed (pain.).    Marland Kitchen omeprazole (PRILOSEC) 20 MG capsule Take 1 capsule (20 mg total) by mouth 2 (two) times daily before a meal. (Patient taking differently: Take 20 mg by mouth 2 (two) times daily as needed (acid reflux/indigestion.). ) 180 capsule 0  . pregabalin (LYRICA) 75 MG capsule Take 75 mg by mouth 2 (two) times daily.    Marland Kitchen triamcinolone cream (KENALOG) 0.1 % Apply 1 application topically  2 (two) times daily. (Patient not taking: Reported on 03/04/2019) 30 g 1    Results for orders placed or performed during the hospital encounter of 03/14/19 (from the past 48 hour(s))  Glucose, capillary     Status: Abnormal   Collection Time: 03/14/19  9:47 AM  Result Value Ref Range   Glucose-Capillary 100 (H) 70 - 99 mg/dL   Comment 1 Notify RN    Comment 2 Document in Chart    No results found.  Pertinent items noted in HPI and remainder of comprehensive ROS otherwise negative.  Blood pressure  (!) 162/84, pulse 83, temperature 97.9 F (36.6 C), temperature source Oral, resp. rate 18, height 6' (1.829 m), weight 115.2 kg, SpO2 98 %.  Patient is awake and alert.  He is oriented and appropriate.  Speech is fluent.  Judgment insight are intact.  Cranial nerve function normal bilateral.  Motor examination extremities reveals weakness of his right-sided quadriceps muscle group grading at 4/5.  He also has some mild weakness of his anterior tibialis on the right.  Remainder is motor strength intact.  Sensory examination with decrease sensation pinprick light touch in his right L4 and L5 dermatomes.  Deep tender versus normal active scepter Achilles reflexes are absent.  No evidence of long track signs.  Gait is antalgic.  Posture is mildly flexed peer examination head ears eyes nose throat is unremarked.  Chest and abdomen are benign.  Extremities are free from injury or deformity. Assessment/Plan L4-5 spondylosis with severe facet arthropathy and significant lateral recess and neuroforaminal stenosis failing conservative management.  Plan bilateral L4-5 decompressive laminotomies and foraminotomies followed by posterior lumbar interbody fusion utilizing interbody cages, local harvested autograft, and augmented with posterior lateral thesis utilizing nonsegmental pedicle screw fixation and local autografting.  Risks and benefits been explained.  Patient wishes to proceed. Mallie Mussel A Toron Bowring 03/14/2019, 10:26 AM

## 2019-03-14 NOTE — Brief Op Note (Signed)
03/14/2019  1:42 PM  PATIENT:  Marcus Armstrong  53 y.o. male  PRE-OPERATIVE DIAGNOSIS:  Spondylolisthesis  POST-OPERATIVE DIAGNOSIS:  Spondylolisthesis  PROCEDURE:  Procedure(s) with comments: Posterior Lumbar Interbody Fusion - Lumbar Four-Lumbar Five (N/A) - Posterior Lumbar Interbody Fusion - Lumbar Four-Lumbar Five  SURGEON:  Surgeon(s) and Role:    * Earnie Larsson, MD - Primary  PHYSICIAN ASSISTANT:   ASSISTANTSMearl Latin   ANESTHESIA:   general  EBL:  50 mL   BLOOD ADMINISTERED:none  DRAINS: none   LOCAL MEDICATIONS USED:  MARCAINE     SPECIMEN:  No Specimen  DISPOSITION OF SPECIMEN:  N/A  COUNTS:  YES  TOURNIQUET:  * No tourniquets in log *  DICTATION: .Dragon Dictation  PLAN OF CARE: Admit to inpatient   PATIENT DISPOSITION:  PACU - hemodynamically stable.   Delay start of Pharmacological VTE agent (>24hrs) due to surgical blood loss or risk of bleeding: yes

## 2019-03-14 NOTE — Anesthesia Procedure Notes (Signed)
Procedure Name: Intubation Date/Time: 03/14/2019 11:32 AM Performed by: Imagene Riches, CRNA Pre-anesthesia Checklist: Patient identified, Emergency Drugs available, Suction available and Patient being monitored Patient Re-evaluated:Patient Re-evaluated prior to induction Oxygen Delivery Method: Circle System Utilized Preoxygenation: Pre-oxygenation with 100% oxygen Induction Type: IV induction Ventilation: Mask ventilation without difficulty Laryngoscope Size: Miller and 3 Grade View: Grade II Tube type: Oral Tube size: 7.5 mm Number of attempts: 1 Airway Equipment and Method: Stylet and Oral airway Placement Confirmation: ETT inserted through vocal cords under direct vision,  positive ETCO2 and breath sounds checked- equal and bilateral Secured at: 22 cm Tube secured with: Tape Dental Injury: Teeth and Oropharynx as per pre-operative assessment

## 2019-03-15 MED ORDER — CHLORPROMAZINE HCL 25 MG PO TABS
25.0000 mg | ORAL_TABLET | Freq: Four times a day (QID) | ORAL | Status: DC | PRN
  Administered 2019-03-15: 25 mg via ORAL
  Filled 2019-03-15 (×2): qty 1

## 2019-03-15 MED ORDER — KETOROLAC TROMETHAMINE 30 MG/ML IJ SOLN
30.0000 mg | Freq: Four times a day (QID) | INTRAMUSCULAR | Status: DC
  Administered 2019-03-15 – 2019-03-16 (×4): 30 mg via INTRAVENOUS
  Filled 2019-03-15 (×4): qty 1

## 2019-03-15 MED ORDER — ALUM & MAG HYDROXIDE-SIMETH 200-200-20 MG/5ML PO SUSP
30.0000 mL | ORAL | Status: DC | PRN
  Filled 2019-03-15: qty 30

## 2019-03-15 NOTE — Progress Notes (Signed)
Postop day 1.  Pain control difficult although improved from last night.  Patient mostly with lower back pain.  States his right lower extremity pain is improved from preop.  Has been able to stand and ambulate with therapy.  Voiding well.  Complains of some abdominal discomfort.  Patient with flatus.  Afebrile.  Vital signs are stable.  Urine output good.  Awake and alert.  Oriented and appropriate.  Obviously uncomfortable.  Chest clear.  Abdomen mildly distended.  Nontender.  Soft otherwise.  Motor and sensory exam stable bilaterally.  Wound clean and dry.  Progressing slowly.  We will add Toradol for improved pain control.  Continue efforts at mobilization.  Likely discharge home tomorrow.

## 2019-03-15 NOTE — Evaluation (Signed)
Physical Therapy Evaluation Patient Details Name: Marcus Armstrong MRN: [id number] DOB: 12-18-1965 Today's Date: 03/15/2019   History of Present Illness  Pt is a 53 yo male s/p PLIF L4-5. PMHx: neurofibromatosis type III.  Clinical Impression  Pt admitted with above diagnosis. At the time of PT eval, pt was able to demonstrate transfers and ambulation with gross min guard assist to supervision for safety. Pt was educated on precautions, brace application/wearing schedule, appropriate activity progression, and car transfer. Pt currently with functional limitations due to the deficits listed below (see PT Problem List). Pt will benefit from skilled PT to increase their independence and safety with mobility to allow discharge to the venue listed below.      Follow Up Recommendations Outpatient PT;Supervision for mobility/OOB    Equipment Recommendations  Rolling walker with 5" wheels    Recommendations for Other Services       Precautions / Restrictions Precautions Precautions: Back;Fall Precaution Booklet Issued: Yes (comment) Precaution Comments: Reviewed handout and pt was cued for precautions during functional mobility.  Required Braces or Orthoses: Spinal Brace Spinal Brace: Applied in standing position;Lumbar corset Restrictions Weight Bearing Restrictions: No      Mobility  Bed Mobility Overal bed mobility: Modified Independent             General bed mobility comments: Pt declines practicing, however reports log roll has been difficult. Verbally reviewed proper sequencing.   Transfers Overall transfer level: Needs assistance Equipment used: None Transfers: Sit to/from Stand Sit to Stand: Supervision         General transfer comment: Able to transition sit<>stand without assistance, however very slow and painful.   Ambulation/Gait Ambulation/Gait assistance: Supervision Gait Distance (Feet): 50 Feet Assistive device: None Gait Pattern/deviations:  Step-through pattern;Decreased stride length;Wide base of support Gait velocity: Decreased Gait velocity interpretation: <1.31 ft/sec, indicative of household ambulator General Gait Details: Pt ambulating very slow and guarded due to pain. Several standing rest breaks required.   Stairs Stairs: Yes Stairs assistance: Min guard Stair Management: One rail Right;Sideways;Step to pattern Number of Stairs: 5 General stair comments: VC's for sequencing and safety. Once one railing used to simulate home environment. Was safer ascending sideways however required cues to maintain good posture. Noted mild knee buckling bilaterally during stair training so hands-on guarding provided for max safety.   Wheelchair Mobility    Modified Rankin (Stroke Patients Only)       Balance Overall balance assessment: Mild deficits observed, not formally tested                                           Pertinent Vitals/Pain Pain Assessment: No/denies pain    Home Living Family/patient expects to be discharged to:: Private residence Living Arrangements: Alone Available Help at Discharge: Family;Available PRN/intermittently(24 hours the first week or so) Type of Home: Apartment Home Access: Stairs to enter Entrance Stairs-Rails: Right Entrance Stairs-Number of Steps: 15 Home Layout: One level Home Equipment: None      Prior Function Level of Independence: Independent with assistive device(s)               Hand Dominance   Dominant Hand: Right    Extremity/Trunk Assessment   Upper Extremity Assessment Upper Extremity Assessment: Defer to OT evaluation    Lower Extremity Assessment Lower Extremity Assessment: Generalized weakness(Consistent with pre-op diagnosis)    Cervical / Trunk Assessment Cervical /  Trunk Assessment: Other exceptions Cervical / Trunk Exceptions: s/p lumbar sx  Communication   Communication: No difficulties  Cognition Arousal/Alertness:  Awake/alert Behavior During Therapy: WFL for tasks assessed/performed Overall Cognitive Status: Within Functional Limits for tasks assessed                                        General Comments      Exercises     Assessment/Plan    PT Assessment Patient needs continued PT services  PT Problem List Decreased strength;Decreased activity tolerance;Decreased balance;Decreased mobility;Decreased knowledge of use of DME;Decreased safety awareness;Decreased knowledge of precautions;Pain       PT Treatment Interventions DME instruction;Gait training;Functional mobility training;Stair training;Therapeutic activities;Therapeutic exercise;Neuromuscular re-education;Patient/family education    PT Goals (Current goals can be found in the Care Plan section)  Acute Rehab PT Goals Patient Stated Goal: to go home PT Goal Formulation: With patient Time For Goal Achievement: 03/22/19 Potential to Achieve Goals: Good    Frequency Min 5X/week   Barriers to discharge        Co-evaluation               AM-PAC PT "6 Clicks" Mobility  Outcome Measure Help needed turning from your back to your side while in a flat bed without using bedrails?: None Help needed moving from lying on your back to sitting on the side of a flat bed without using bedrails?: A Little Help needed moving to and from a bed to a chair (including a wheelchair)?: A Little Help needed standing up from a chair using your arms (e.g., wheelchair or bedside chair)?: A Little Help needed to walk in hospital room?: A Little Help needed climbing 3-5 steps with a railing? : A Little 6 Click Score: 19    End of Session Equipment Utilized During Treatment: Gait belt;Back brace Activity Tolerance: Patient limited by pain Patient left: Other (comment)(Standing in room against the wall - declined sitting) Nurse Communication: Mobility status PT Visit Diagnosis: Unsteadiness on feet (R26.81);Pain Pain - part of  body: (back)    Time: [id number] PT Time Calculation (min) (ACUTE ONLY): 17 min   Charges:   PT Evaluation $PT Eval Moderate Complexity: 1 Mod          Rolinda Roan, PT, DPT Acute Rehabilitation Services Pager: [phone] Office: [phone]   Thelma Comp 03/15/2019, 1:42 PM

## 2019-03-15 NOTE — Evaluation (Signed)
Occupational Therapy Evaluation Patient Details Name: Marcus Armstrong MRN: [id number] DOB: February 04, 1966 Today's Date: 03/15/2019    History of Present Illness Pt is a 53 yo male s/p PLIF L4-5. PMHx: neurofibromatosis type III.   Clinical Impression   Pt PTA: Pt living with 22 yo son at home and reports independence with ADL and mobility prior, but increased time and pain. Pt currently limited by increased pain, decreased ability to mobilize without heavy reliance on RW and decreased ability to perform LB ADL without AE. Pt donning/doffing brace very well. Pt education for hip kit and use. Pt with good carry over skills after demo. Pt would benefit from continued OT skilled services for ADL, mobility and safety in [place] setting. OT following acutely for tub transfer and continuing LB dressing. Back handout provided and reviewed ADL in detail. Pt educated on: clothing between brace, never sleep in brace, set an alarm at night for medication, avoid sitting for long periods of time, correct bed positioning for sleeping, correct sequence for bed mobility, avoiding lifting more than 5 pounds and never wash directly over incision. All education is complete and patient indicates understanding.       Follow Up Recommendations  Home health OT;Outpatient OT;Follow surgeon's recommendation for DC plan and follow-up therapies    Equipment Recommendations  3 in 1 bedside commode    Recommendations for Other Services       Precautions / Restrictions Precautions Precautions: Back Precaution Booklet Issued: Yes (comment) Precaution Comments: verbal discussion of handout Required Braces or Orthoses: Spinal Brace Spinal Brace: Applied in standing position;Lumbar corset Restrictions Weight Bearing Restrictions: No      Mobility Bed Mobility Overal bed mobility: Modified Independent             General bed mobility comments: log roll education  Transfers Overall transfer level: Needs  assistance Equipment used: Rolling walker (2 wheeled) Transfers: Sit to/from Stand Sit to Stand: Supervision              Balance Overall balance assessment: Mild deficits observed, not formally tested                                         ADL either performed or assessed with clinical judgement   ADL Overall ADL's : Needs assistance/impaired Eating/Feeding: Modified independent;Sitting   Grooming: Supervision/safety;Sitting;Cueing for safety   Upper Body Bathing: Supervision/ safety;Standing Upper Body Bathing Details (indicate cue type and reason): leaning hips on sink for stability Lower Body Bathing: Minimal assistance;With adaptive equipment;Cueing for safety;Cueing for sequencing;Sit to/from stand;Sitting/lateral leans   Upper Body Dressing : Set up;Sitting   Lower Body Dressing: Min guard;Minimal assistance;Cueing for safety;With adaptive equipment;Cueing for sequencing;Sitting/lateral leans;Sit to/from stand   Toilet Transfer: Min guard;Cueing for safety;Cueing for sequencing;Ambulation;Regular Toilet;Grab bars;RW   Toileting- Clothing Manipulation and Hygiene: Set up;Supervision/safety;Cueing for safety;Cueing for sequencing;Sitting/lateral lean;Sit to/from stand;Adhering to back precautions       Functional mobility during ADLs: Supervision/safety;Rolling walker General ADL Comments: Pt education performed for hip kit to perform LB dressing. Pt with good carry over skills and able to donn/doff items with set-upA using AE. Pt performing ADL functional mobility with heavy reliance on RW.     Vision Baseline Vision/History: No visual deficits Vision Assessment?: No apparent visual deficits     Perception     Praxis      Pertinent Vitals/Pain  Hand Dominance Right   Extremity/Trunk Assessment Upper Extremity Assessment Upper Extremity Assessment: Overall WFL for tasks assessed   Lower Extremity Assessment Lower Extremity  Assessment: Defer to PT evaluation   Cervical / Trunk Assessment Cervical / Trunk Assessment: Other exceptions Cervical / Trunk Exceptions: s/p lumbar sx   Communication Communication Communication: No difficulties   Cognition Arousal/Alertness: Awake/alert Behavior During Therapy: WFL for tasks assessed/performed Overall Cognitive Status: Within Functional Limits for tasks assessed                                     General Comments       Exercises     Shoulder Instructions      Home Living Family/patient expects to be discharged to:: Private residence Living Arrangements: Alone Available Help at Discharge: Family;Available PRN/intermittently Type of Home: Apartment Home Access: Stairs to enter Entrance Stairs-Number of Steps: 15 Entrance Stairs-Rails: Right Home Layout: One level     Bathroom Shower/Tub: Teacher, early years/pre: Standard     Home Equipment: None          Prior Functioning/Environment Level of Independence: Independent with assistive device(s)                 OT Problem List: Decreased strength;Decreased activity tolerance;Impaired balance (sitting and/or standing);Decreased safety awareness;Pain      OT Treatment/Interventions: Self-care/ADL training;Therapeutic exercise;Energy conservation;DME and/or AE instruction;Therapeutic activities;Patient/family education;Balance training    OT Goals(Current goals can be found in the care plan section) Acute Rehab OT Goals Patient Stated Goal: to go home OT Goal Formulation: With patient Time For Goal Achievement: 03/29/19 Potential to Achieve Goals: Good ADL Goals Pt Will Perform Lower Body Dressing: with set-up;with adaptive equipment;sitting/lateral leans;sit to/from stand Pt Will Perform Tub/Shower Transfer: with supervision;ambulating  OT Frequency: Min 2X/week   Barriers to D/C:            Co-evaluation              AM-PAC OT "6 Clicks" Daily  Activity     Outcome Measure Help from another person eating meals?: None Help from another person taking care of personal grooming?: None Help from another person toileting, which includes using toliet, bedpan, or urinal?: A Little Help from another person bathing (including washing, rinsing, drying)?: A Little Help from another person to put on and taking off regular upper body clothing?: None Help from another person to put on and taking off regular lower body clothing?: A Little 6 Click Score: 21   End of Session Equipment Utilized During Treatment: Rolling walker;Back brace Nurse Communication: Mobility status  Activity Tolerance: Patient tolerated treatment well Patient left: in bed;with call bell/phone within reach  OT Visit Diagnosis: Unsteadiness on feet (R26.81);Pain;Muscle weakness (generalized) (M62.81) Pain - Right/Left: Left Pain - part of body: Leg                Time: 2334-3568 OT Time Calculation (min): 35 min Charges:  OT General Charges $OT Visit: 1 Visit OT Evaluation $OT Eval Moderate Complexity: 1 Mod OT Treatments $Self Care/Home Management : 8-22 mins  Ebony Hail Harold Hedge) Marsa Aris OTR/L Acute Rehabilitation Services Pager: [phone] Office: [phone]   MVVKPQA E SLPNP 03/15/2019, 10:36 AM

## 2019-03-16 MED ORDER — DIAZEPAM 5 MG PO TABS: 5.0000 mg | ORAL_TABLET | Freq: Four times a day (QID) | ORAL | 0 refills | Status: DC | PRN

## 2019-03-16 MED ORDER — BISACODYL 5 MG PO TBEC
10.0000 mg | DELAYED_RELEASE_TABLET | Freq: Every day | ORAL | Status: DC | PRN
  Administered 2019-03-16: 08:00:00 10 mg via ORAL
  Filled 2019-03-16: qty 2

## 2019-03-16 MED ORDER — OXYCODONE HCL 10 MG PO TABS: 5.0000 mg | ORAL_TABLET | ORAL | 0 refills | Status: DC | PRN

## 2019-03-16 NOTE — Plan of Care (Signed)
Pt doing well. Pt given D/C instructions with verbal understanding. Rx's were sent to pharmacy by MD. Pt's incision is clean and dry with no sign of infection. Pt's IV was removed prior to D/C. Pt received 3-n-1 from [facility] per MD order. Pt D/C'd home via wheelchair per MD order. Pt is stable @ D/C and has no other needs at this time. Holli Humbles, RN

## 2019-03-16 NOTE — Progress Notes (Signed)
Physical Therapy Treatment and Discharge Patient Details Name: Marcus Armstrong MRN: [id number] DOB: 1966-02-02 Today's Date: 03/16/2019    History of Present Illness Pt is a 53 yo male s/p PLIF L4-5. PMHx: neurofibromatosis type III.    PT Comments    Pt progressing well with post-op mobility. He was able to demonstrate transfers and ambulation with gross modified independence and no AD. Reinforced education on precautions, brace application/wearing schedule, appropriate activity progression, and car transfer. As pt has met acute PT goals, will d/c from acute PT services at this time. If needs change, please reconsult.      Follow Up Recommendations  No PT follow up;Supervision for mobility/OOB     Equipment Recommendations  None recommended by PT    Recommendations for Other Services       Precautions / Restrictions Precautions Precautions: Back;Fall Precaution Booklet Issued: Yes (comment) Precaution Comments: Reviewed handout and pt was cued for precautions during functional mobility.  Required Braces or Orthoses: Spinal Brace Spinal Brace: Lumbar corset;Applied in sitting position Restrictions Weight Bearing Restrictions: No    Mobility  Bed Mobility Overal bed mobility: Modified Independent             General bed mobility comments: HOB slightly elevated but able to complete without assist  Transfers Overall transfer level: Modified independent Equipment used: None Transfers: Sit to/from Stand              Ambulation/Gait Ambulation/Gait assistance: Modified independent (Device/Increase time) Gait Distance (Feet): 300 Feet Assistive device: None Gait Pattern/deviations: Step-through pattern;Decreased stride length;Wide base of support Gait velocity: Decreased Gait velocity interpretation: 1.31 - 2.62 ft/sec, indicative of limited community ambulator General Gait Details: No unsteadiness or LOB noted. Pt with improved gait speed from previous session.     Stairs Stairs: Yes Stairs assistance: Modified independent (Device/Increase time) Stair Management: One rail Right;Step to pattern Number of Stairs: 10 General stair comments: VC's for sequencing and safety. Once one railing used to simulate home environment. Was safer ascending sideways however required cues to maintain good posture. Noted mild knee buckling bilaterally during stair training so hands-on guarding provided for max safety.    Wheelchair Mobility    Modified Rankin (Stroke Patients Only)       Balance Overall balance assessment: Mild deficits observed, not formally tested                                          Cognition Arousal/Alertness: Awake/alert Behavior During Therapy: WFL for tasks assessed/performed Overall Cognitive Status: Within Functional Limits for tasks assessed                                        Exercises      General Comments        Pertinent Vitals/Pain Pain Assessment: No/denies pain    Home Living                      Prior Function            PT Goals (current goals can now be found in the care plan section) Acute Rehab PT Goals Patient Stated Goal: to go home PT Goal Formulation: With patient Time For Goal Achievement: 03/22/19 Potential to Achieve Goals: Good Progress towards PT goals: Goals met/education completed,  patient discharged from PT    Frequency    Min 5X/week      PT Plan Discharge plan needs to be updated;Equipment recommendations need to be updated    Co-evaluation              AM-PAC PT "6 Clicks" Mobility   Outcome Measure  Help needed turning from your back to your side while in a flat bed without using bedrails?: None Help needed moving from lying on your back to sitting on the side of a flat bed without using bedrails?: None Help needed moving to and from a bed to a chair (including a wheelchair)?: None Help needed standing up from a  chair using your arms (e.g., wheelchair or bedside chair)?: None Help needed to walk in hospital room?: None Help needed climbing 3-5 steps with a railing? : A Little 6 Click Score: 23    End of Session Equipment Utilized During Treatment: Gait belt;Back brace Activity Tolerance: Patient limited by pain Patient left: in bed;with call bell/phone within reach Nurse Communication: Mobility status PT Visit Diagnosis: Unsteadiness on feet (R26.81);Pain Pain - part of body: (back)     Time: 6943-7005 PT Time Calculation (min) (ACUTE ONLY): 15 min  Charges:  $Gait Training: 8-22 mins                     Rolinda Roan, PT, DPT Acute Rehabilitation Services Pager: [phone] Office: [phone]    Thelma Comp 03/16/2019, 9:17 AM

## 2019-03-16 NOTE — Discharge Instructions (Signed)

## 2019-03-16 NOTE — Discharge Summary (Signed)
Physician Discharge Summary  Patient ID: Marcus Armstrong MRN: [id number] DOB/AGE: 07/17/1965 53 y.o.  Admit date: 03/14/2019 Discharge date: 03/16/2019  Admission Diagnoses:  Discharge Diagnoses:  Active Problems:   Lumbar foraminal stenosis   Discharged Condition: good  Hospital Course: Patient been to the hospital where he underwent uncomplicated lumbar decompression and fusion surgery.  Postoperatively doing very well.  Preoperative pain resolved.  Standing walking and voiding without difficulty.  Ready for discharge home.  Consults:   Significant Diagnostic Studies:   Treatments:   Discharge Exam: Blood pressure 134/73, pulse 90, temperature 98.2 F (36.8 C), temperature source Oral, resp. rate 18, height 6' (1.829 m), weight 115.2 kg, SpO2 96 %. Awake and alert.  Oriented and appropriate.  Motor and sensory function intact.  Wound clean and dry.  Chest and abdomen benign.  Extremities free from injury or deformity.  Disposition: Discharge disposition: 01-Home or Self Care        Allergies as of 03/16/2019      Reactions   Cat Hair Extract Hives, Shortness Of Breath, Swelling, Rash, Other (See Comments)   Shellfish Allergy Shortness Of Breath, Itching, Swelling   Eye ball swelling (when not eating fresh seafood, breathing problems)      Medication List    TAKE these medications   amLODipine 10 MG tablet Commonly known as: NORVASC Take 1 tablet (10 mg total) by mouth daily.   Cannabidiol Powd Apply 1 application topically 3 (three) times daily as needed (pain.). CBD Pain Relief Roll-O   cetirizine 10 MG tablet Commonly known as: ZYRTEC Take 10 mg by mouth every other day.   cyclobenzaprine 10 MG tablet Commonly known as: FLEXERIL Take 1 tablet (10 mg total) by mouth 3 (three) times daily as needed for muscle spasms.   diazepam 5 MG tablet Commonly known as: VALIUM Take 1-2 tablets (5-10 mg total) by mouth every 6 (six) hours as needed for muscle  spasms.   Diclofenac Sodium 2 % Soln Commonly known as: Pennsaid Place 1 application onto the skin 2 (two) times daily. What changed:   when to take this  reasons to take this   fluticasone 50 MCG/ACT nasal spray Commonly known as: FLONASE Place 1-2 sprays into both nostrils daily as needed (allergies.).   ibuprofen 800 MG tablet Commonly known as: ADVIL Take 1 tablet (800 mg total) by mouth every 8 (eight) hours as needed. What changed: reasons to take this   losartan-hydrochlorothiazide 100-25 MG tablet Commonly known as: HYZAAR Take 1 tablet by mouth daily.   naproxen sodium 220 MG tablet Commonly known as: ALEVE Take 440 mg by mouth 3 (three) times daily as needed (pain.).   omeprazole 20 MG capsule Commonly known as: PRILOSEC Take 1 capsule (20 mg total) by mouth 2 (two) times daily before a meal. What changed:   when to take this  reasons to take this   Oxycodone HCl 10 MG Tabs Take 0.5-1 tablets (5-10 mg total) by mouth every 3 (three) hours as needed for severe pain ((score 7 to 10)).   pregabalin 75 MG capsule Commonly known as: LYRICA Take 75 mg by mouth 2 (two) times daily.   triamcinolone cream 0.1 % Commonly known as: KENALOG Apply 1 application topically 2 (two) times daily.            Durable Medical Equipment  (From admission, onward)         Start     Ordered   03/14/19 1508  DME Walker rolling  Once    Question:  Patient needs a walker to treat with the following condition  Answer:  Lumbar foraminal stenosis   03/14/19 1507   03/14/19 1508  DME 3 n 1  Once     03/14/19 1507           Signed: Mallie Mussel A Mischa Pollard 03/16/2019, 8:21 AM

## 2019-03-18 DIAGNOSIS — M4310 Spondylolisthesis, site unspecified: Secondary | ICD-10-CM | POA: Insufficient documentation

## 2019-04-06 ENCOUNTER — Ambulatory Visit: Payer: Managed Care, Other (non HMO) | Admitting: Neurology

## 2019-05-30 ENCOUNTER — Other Ambulatory Visit: Payer: Self-pay | Admitting: Family Medicine

## 2019-05-30 NOTE — Telephone Encounter (Signed)
I do not see this meditation on pt's med list. Last OV 01/05/19 Please advise.

## 2019-05-31 ENCOUNTER — Other Ambulatory Visit: Payer: Self-pay | Admitting: Family Medicine

## 2019-05-31 ENCOUNTER — Other Ambulatory Visit: Payer: Self-pay

## 2019-05-31 DIAGNOSIS — G8929 Other chronic pain: Secondary | ICD-10-CM

## 2019-05-31 DIAGNOSIS — H1033 Unspecified acute conjunctivitis, bilateral: Secondary | ICD-10-CM

## 2019-05-31 NOTE — Telephone Encounter (Signed)
MC-Plz see refill req/pt has appt with you tomorrow/thx dmf

## 2019-05-31 NOTE — Telephone Encounter (Signed)
Pt must call to justify need/thx dmf

## 2019-06-01 ENCOUNTER — Ambulatory Visit (INDEPENDENT_AMBULATORY_CARE_PROVIDER_SITE_OTHER): Payer: Managed Care, Other (non HMO) | Admitting: Family Medicine

## 2019-06-01 ENCOUNTER — Encounter: Payer: Self-pay | Admitting: Family Medicine

## 2019-06-01 VITALS — BP 122/80 | HR 77 | Temp 97.5°F | Ht 72.0 in | Wt 272.8 lb

## 2019-06-01 DIAGNOSIS — L219 Seborrheic dermatitis, unspecified: Secondary | ICD-10-CM

## 2019-06-01 DIAGNOSIS — Z1211 Encounter for screening for malignant neoplasm of colon: Secondary | ICD-10-CM

## 2019-06-01 DIAGNOSIS — Z23 Encounter for immunization: Secondary | ICD-10-CM

## 2019-06-01 DIAGNOSIS — L905 Scar conditions and fibrosis of skin: Secondary | ICD-10-CM | POA: Diagnosis not present

## 2019-06-01 DIAGNOSIS — Z86010 Personal history of colonic polyps: Secondary | ICD-10-CM

## 2019-06-01 MED ORDER — KETOCONAZOLE 2 % EX SHAM: 1.0000 "application " | MEDICATED_SHAMPOO | CUTANEOUS | 0 refills | Status: DC

## 2019-06-01 MED ORDER — MEDERMA EX GEL: CUTANEOUS | 0 refills | Status: DC

## 2019-06-01 NOTE — Addendum Note (Signed)
Addended by: Darral Dash on: 06/01/2019 03:50 PM   Modules accepted: Orders

## 2019-06-01 NOTE — Patient Instructions (Addendum)
Health Maintenance Due  Topic Date Due  . HIV Screening  01/18/1980  . TETANUS/TDAP Pt will receive Tdap today. 01/18/1984  . COLONOSCOPY Pt would like a referral. 01/18/2015    No flowsheet data found.  Mederma  Derma E scar gel Advanced Silicone Scar Gel [employer] is the brand)

## 2019-06-01 NOTE — Progress Notes (Signed)
Marcus Armstrong is a 54 y.o. male  Chief Complaint  Patient presents with  . Follow-up    Pt would like a referral for colonoscopy and dermatologist.  Pt will receive the Tdap vaccine today.    HPI: Marcus Armstrong is a 54 y.o. male here with a few concerns/issues today. 1. Referral for colonoscopy - h/o polyp, most recent 5-7 years ago 2. Tdap vaccine needed 3. Referral to dermatology - white spots on scalp, dry and flaky. No itchy.   Past Medical History:  Diagnosis Date  . GERD (gastroesophageal reflux disease)   . History of kidney stones 2016  . Hypertension   . Neurofibromatosis ([place])   . Sleep apnea     Past Surgical History:  Procedure Laterality Date  . 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 COLON SURGERY     colonoscpy with polyp resection  . polyp removal      Social History   Socioeconomic History  . Marital status: Divorced    Spouse name: Not on file  . Number of children: Not on file  . Years of education: Not on file  . Highest education level: Not on file  Occupational History  . Not on file  Tobacco Use  . Smoking status: Former Smoker    Packs/day: 0.25    Years: 30.00    Pack years: 7.50    Types: Cigarettes    Quit date: 06/06/2018    Years since quitting: 0.9  . Smokeless tobacco: Never Used  Substance and Sexual Activity  . Alcohol use: Yes    Comment: occasional beer  . 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 Sexual activity: Not on file  Other Topics Concern  . Not on file  Social History Narrative   Right handed    Social Determinants of Health   Financial Resource Strain:   . Difficulty of Paying Living Expenses: Not on file  Food Insecurity:   . Worried About Charity fundraiser in the Last Year: Not on file  . Ran Out of Food in the Last  Year: Not on file  Transportation Needs:   . Lack of Transportation (Medical): Not on file  . Lack of Transportation (Non-Medical): Not on file  Physical Activity:   . Days of Exercise per Week: Not on file  . Minutes of Exercise per Session: Not on file  Stress:   . Feeling of Stress : Not on file  Social Connections:   . Frequency of Communication with Friends and Family: Not on file  . Frequency of Social Gatherings with Friends and Family: Not on file  . Attends Religious Services: Not on file  . Active Member of Clubs or Organizations: Not on file  . Attends Archivist Meetings: Not on file  . Marital Status: Not on file  Intimate Partner Violence:   . Fear of Current or Ex-Partner: Not on file  . Emotionally Abused: Not on file  . Physically Abused: Not on file  . Sexually Abused: Not on file    Family History  Problem Relation Age of Onset  . Cancer Father 89       prostate      There is no immunization history on file for this patient.  Outpatient Encounter Medications as of 06/01/2019  Medication Sig  . amLODipine (NORVASC) 10 MG tablet Take 1 tablet (10 mg total) by mouth daily.  . Cannabidiol POWD Apply 1 application topically 3 (three) times daily as needed (pain.). CBD Pain Relief Roll-O  . cetirizine (ZYRTEC) 10 MG tablet Take 10 mg by mouth every other day.   . cyclobenzaprine (FLEXERIL) 10 MG tablet Take 1 tablet (10 mg total) by mouth 3 (three) times daily as needed for muscle spasms.  . diazepam (VALIUM) 5 MG tablet Take 1-2 tablets (5-10 mg total) by mouth every 6 (six) hours as needed for muscle spasms.  . Diclofenac Sodium (PENNSAID) 2 % SOLN Place 1 application onto the skin 2 (two) times daily. (Patient taking differently: Place 1 application onto the skin 2 (two) times daily as needed (pain.). )  . fluticasone (FLONASE) 50 MCG/ACT nasal spray Place 1-2 sprays into both nostrils daily as needed (allergies.).   Marland Kitchen ibuprofen (ADVIL) 800 MG tablet  Take 1 tablet (800 mg total) by mouth every 8 (eight) hours as needed. (Patient taking differently: Take 800 mg by mouth every 8 (eight) hours as needed (pain). )  . losartan-hydrochlorothiazide (HYZAAR) 100-25 MG tablet Take 1 tablet by mouth daily.  . naproxen sodium (ALEVE) 220 MG tablet Take 440 mg by mouth 3 (three) times daily as needed (pain.).  Marland Kitchen omeprazole (PRILOSEC) 20 MG capsule Take 1 capsule (20 mg total) by mouth 2 (two) times daily before a meal. (Patient taking differently: Take 20 mg by mouth 2 (two) times daily as needed (acid reflux/indigestion.). )  . pregabalin (LYRICA) 75 MG capsule Take 75 mg by mouth 2 (two) times daily.  Marland Kitchen oxyCODONE 10 MG TABS Take 0.5-1 tablets (5-10 mg total) by mouth every 3 (three) hours as needed for severe pain ((score 7 to 10)).  Marland Kitchen triamcinolone cream (KENALOG) 0.1 % Apply 1 application topically 2 (two) times daily. (Patient not taking: Reported on 03/04/2019)   No facility-administered encounter medications on file as of 06/01/2019.     ROS: Pertinent positives and negatives noted in HPI. Remainder of ROS non-contributory    Allergies  Allergen Reactions  . Cat Hair Extract Hives, Shortness Of Breath, Swelling, Rash and Other (See Comments)  . Shellfish Allergy Shortness Of Breath, Itching and Swelling    Eye ball swelling (when not eating fresh seafood, breathing problems)    BP 122/80 (BP Location: Left Arm, Patient Position: Sitting, Cuff Size: Large)   Pulse 77   Temp (!) 97.5 F (36.4 C) (Temporal)   Ht 6' (1.829 m)   Wt 272 lb 12.8 oz (123.7 kg)   SpO2 97%   BMI 37.00 kg/m   Wt Readings from Last 3 Encounters:  06/01/19 272 lb 12.8 oz (123.7 kg)  03/14/19 254 lb (115.2 kg)  03/09/19 254 lb 3 oz (115.3 kg)     Physical Exam  Constitutional: He is oriented to person, place, and time. He appears well-developed and well-nourished. No distress.  Neurological: He is alert and oriented to person, place, and time.  Skin:    Thick dry patches on anterior scalp  Psychiatric: He has a normal mood and affect. His behavior is normal.     A/P:  1. Screening for colon cancer 2. H/O adenomatous polyp of colon - Ambulatory referral to Gastroenterology  3. Seborrheic dermatitis of scalp - Ambulatory referral to Dermatology Rx: - ketoconazole (NIZORAL) 2 % shampoo; Apply 1 application topically 2 (two) times a week.  Dispense: 120 mL; Refill: 0 Discussed plan and  reviewed medications with patient, including risks, benefits, and potential side effects. Pt expressed understand. All questions answered.   This visit occurred during the SARS-CoV-2 public health emergency.  Safety protocols were in place, including screening questions prior to the visit, additional usage of staff PPE, and extensive cleaning of exam room while observing appropriate contact time as indicated for disinfecting solutions.

## 2019-06-02 ENCOUNTER — Telehealth: Payer: Self-pay | Admitting: Family Medicine

## 2019-06-02 NOTE — Telephone Encounter (Signed)
Patient is returning the call. CB is [phone]

## 2019-06-02 NOTE — Telephone Encounter (Signed)
Would you please reach out to pt to see if he is still taking this med/dose and who initially Rx'd it? Thanks

## 2019-06-02 NOTE — Telephone Encounter (Signed)
Left message on voicemail to call office.  

## 2019-06-02 NOTE — Telephone Encounter (Signed)
Informed pt about med refill request from pharmacy.

## 2019-06-02 NOTE — Telephone Encounter (Signed)
Pt informed me that this rx was prescribed from a doctor in [facility].  Pt said it's been over a yr since he saw that PCP.  Pt explained that he requested the Gabapentin 300mg  due to the pharmacy being out of Lyrica.  Pt said that he was taking up to 900mg  on some days and 600mg  on other days.

## 2019-06-09 ENCOUNTER — Encounter: Payer: Self-pay | Admitting: Family Medicine

## 2019-06-15 ENCOUNTER — Encounter: Payer: Self-pay | Admitting: Neurology

## 2019-06-19 ENCOUNTER — Encounter: Payer: Self-pay | Admitting: Family Medicine

## 2019-06-21 ENCOUNTER — Telehealth: Payer: Self-pay

## 2019-06-21 NOTE — Telephone Encounter (Signed)
Tried to call to schedule and it went directly to voicemail without ringing, I left a voicemail and sent mychart message for pt to call back and schedule

## 2019-06-21 NOTE — Telephone Encounter (Signed)
This is per Dr. Loletha Grayer "Please schedule pt for an in-person appt with myself (or another provider if I have no availability) this week. Thanks."/thx dmf

## 2019-06-23 ENCOUNTER — Other Ambulatory Visit: Payer: Self-pay

## 2019-06-24 ENCOUNTER — Encounter: Payer: Self-pay | Admitting: Family Medicine

## 2019-06-24 ENCOUNTER — Ambulatory Visit (INDEPENDENT_AMBULATORY_CARE_PROVIDER_SITE_OTHER): Payer: Managed Care, Other (non HMO) | Admitting: Family Medicine

## 2019-06-24 VITALS — BP 138/82 | HR 80 | Temp 97.3°F | Ht 72.0 in | Wt 271.2 lb

## 2019-06-24 DIAGNOSIS — F419 Anxiety disorder, unspecified: Secondary | ICD-10-CM

## 2019-06-24 DIAGNOSIS — R002 Palpitations: Secondary | ICD-10-CM

## 2019-06-24 MED ORDER — HYDROXYZINE PAMOATE 50 MG PO CAPS: 50.0000 mg | ORAL_CAPSULE | Freq: Every evening | ORAL | 1 refills | Status: DC | PRN

## 2019-06-24 NOTE — Progress Notes (Signed)
Marcus Armstrong is a 54 y.o. male  Chief Complaint  Patient presents with  . Follow-up    Pt states some heart palpitations for 3 weeks//pt scheduled for PT in next few weeks and making sure hes ok//    HPI: Marcus Armstrong is a 54 y.o. male who complains of feeling as though his heart rhythm has been different for the past few weeks. He is scheduled to start PT for his back on 4/5 but pt was concerned about his heart and wanted to discuss/eval.  Symptoms mainly at night, occurring 2-4 nights per week. He feels like his heart is "beating fast and hard". Lasts 15-35min at a time.  No CP but at times he feels a "pinch". No SOB, DOE. No n/v. No diaphoresis.  He does note that his son went back to [facility] and has been gone for 3-4 wks, was only supposed to be 1-2wks. Stressful relationship with ex-wife. He has a h/o anxiety and panic attack in 2013-2014.  Pt has a h/o HTN, OSA. He had a cardiac cath in 02/2012 that was normal.  He is a former smoker.  Fam hx neg for cardiac disease.  Past Medical History:  Diagnosis Date  . GERD (gastroesophageal reflux disease)   . History of kidney stones 2016  . Hypertension   . Neurofibromatosis ([place])   . Sleep apnea     Past Surgical History:  Procedure Laterality Date  . CARDIAC CATHETERIZATION  02/27/2012   [facility]): Normal coronaries aside from mLAD myocardial bridging verus coronary spasm, LV > 65%  . COLON SURGERY     colonoscpy with polyp resection  . polyp removal      Social History   Socioeconomic History  . Marital status: Divorced    Spouse name: Not on file  . Number of children: Not on file  . Years of education: Not on file  . Highest education level: Not on file  Occupational History  . Not on file  Tobacco Use  . Smoking status: Former Smoker    Packs/day: 0.25    Years: 30.00    Pack years: 7.50    Types: Cigarettes    Quit date: 06/06/2018    Years since quitting: 1.0  . Smokeless tobacco: Never Used   Substance and Sexual Activity  . Alcohol use: Yes    Comment: occasional beer  . 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 Sexual activity: Not on file  Other Topics Concern  . Not on file  Social History Narrative   Right handed    Social Determinants of Health   Financial Resource Strain:   . Difficulty of Paying Living Expenses:   Food Insecurity:   . Worried About Charity fundraiser in the Last Year:   . Arboriculturist in the Last Year:   Transportation Needs:   . Film/video editor (Medical):   Marland Kitchen Lack of Transportation (Non-Medical):   Physical Activity:   . Days of Exercise per Week:   . Minutes of Exercise per Session:   Stress:   . Feeling of Stress :   Social Connections:   . Frequency of Communication with Friends and Family:   . Frequency of Social Gatherings with Friends and Family:   . Attends Religious Services:   . Active Member of Clubs or Organizations:   . Attends Archivist Meetings:   .  Marital Status:   Intimate Partner Violence:   . Fear of Current or Ex-Partner:   . Emotionally Abused:   Marland Kitchen Physically Abused:   . Sexually Abused:     Family History  Problem Relation Age of Onset  . Cancer Father 5       prostate     Immunization History  Administered Date(s) Administered  . Tdap 06/01/2019    Outpatient Encounter Medications as of 06/24/2019  Medication Sig  . amLODipine (NORVASC) 10 MG tablet Take 1 tablet (10 mg total) by mouth daily.  . Cannabidiol POWD Apply 1 application topically 3 (three) times daily as needed (pain.). CBD Pain Relief Roll-O  . cetirizine (ZYRTEC) 10 MG tablet Take 10 mg by mouth every other day.   . cyclobenzaprine (FLEXERIL) 10 MG tablet TAKE 1 TABLET(10 MG) BY MOUTH THREE TIMES DAILY AS NEEDED FOR MUSCLE SPASMS  . diazepam (VALIUM) 5 MG tablet Take 1-2 tablets (5-10 mg total) by mouth every 6 (six) hours as needed for  muscle spasms.  . Diclofenac Sodium (PENNSAID) 2 % SOLN Place 1 application onto the skin 2 (two) times daily. (Patient taking differently: Place 1 application onto the skin 2 (two) times daily as needed (pain.). )  . fluticasone (FLONASE) 50 MCG/ACT nasal spray Place 1-2 sprays into both nostrils daily as needed (allergies.).   Marland Kitchen gabapentin (NEURONTIN) 300 MG capsule TAKE ONE CAPSULE BY MOUTH THREE TIMES DAILY  . ibuprofen (ADVIL) 800 MG tablet Take 1 tablet (800 mg total) by mouth every 8 (eight) hours as needed. (Patient taking differently: Take 800 mg by mouth every 8 (eight) hours as needed (pain). )  . ketoconazole (NIZORAL) 2 % shampoo Apply 1 application topically 2 (two) times a week.  . losartan-hydrochlorothiazide (HYZAAR) 100-25 MG tablet Take 1 tablet by mouth daily.  Marland Kitchen omeprazole (PRILOSEC) 20 MG capsule Take 1 capsule (20 mg total) by mouth 2 (two) times daily before a meal. (Patient taking differently: Take 20 mg by mouth 2 (two) times daily as needed (acid reflux/indigestion.). )  . pregabalin (LYRICA) 75 MG capsule Take 75 mg by mouth 2 (two) times daily.  . Scar Treatment Products [facility]) GEL Apply to affected area BID  . naproxen sodium (ALEVE) 220 MG tablet Take 440 mg by mouth 3 (three) times daily as needed (pain.).  . [DISCONTINUED] oxyCODONE 10 MG TABS Take 0.5-1 tablets (5-10 mg total) by mouth every 3 (three) hours as needed for severe pain ((score 7 to 10)).  . [DISCONTINUED] triamcinolone cream (KENALOG) 0.1 % Apply 1 application topically 2 (two) times daily. (Patient not taking: Reported on 03/04/2019)   No facility-administered encounter medications on file as of 06/24/2019.     ROS: Pertinent positives and negatives noted in HPI. Remainder of ROS non-contributory  Allergies  Allergen Reactions  . Cat Hair Extract Hives, Shortness Of Breath, Swelling, Rash and Other (See Comments)  . Shellfish Allergy Shortness Of Breath, Itching and Swelling    Eye ball  swelling (when not eating fresh seafood, breathing problems)    BP 138/82 (BP Location: Left Arm, Patient Position: Sitting, Cuff Size: Large)   Pulse 80   Temp (!) 97.3 F (36.3 C) (Tympanic)   Ht 6' (1.829 m)   Wt 271 lb 3.2 oz (123 kg)   SpO2 95%   BMI 36.78 kg/m   BP Readings from Last 3 Encounters:  06/24/19 138/82  06/01/19 122/80  03/16/19 134/73   Physical Exam  Constitutional: He is oriented to person,  place, and time. He appears well-developed and well-nourished. No distress.  Cardiovascular: Normal rate, regular rhythm and normal heart sounds.  No murmur heard. Pulmonary/Chest: Effort normal and breath sounds normal. No respiratory distress.  Neurological: He is alert and oriented to person, place, and time.  Psychiatric: He has a normal mood and affect. His behavior is normal.     A/P:  1. Palpitations 2. Anxiety - stress with health, family - cont to work on meditation, breathing techniques Rx: - hydrOXYzine (VISTARIL) 50 MG capsule; Take 1 capsule (50 mg total) by mouth at bedtime as needed.  Dispense: 30 capsule; Refill: 1 Discussed plan and reviewed medications with patient, including risks, benefits, and potential side effects. Pt expressed understand. All questions answered.   This visit occurred during the SARS-CoV-2 public health emergency.  Safety protocols were in place, including screening questions prior to the visit, additional usage of staff PPE, and extensive cleaning of exam room while observing appropriate contact time as indicated for disinfecting solutions.

## 2019-06-26 ENCOUNTER — Encounter: Payer: Self-pay | Admitting: Neurology

## 2019-06-27 ENCOUNTER — Other Ambulatory Visit: Payer: Self-pay | Admitting: Family Medicine

## 2019-06-27 ENCOUNTER — Telehealth: Payer: Self-pay

## 2019-06-27 DIAGNOSIS — G8929 Other chronic pain: Secondary | ICD-10-CM

## 2019-06-27 NOTE — Telephone Encounter (Signed)
Last OV 06/24/19 Last fill 06/02/19  #60/0

## 2019-06-27 NOTE — Telephone Encounter (Signed)
I called pt to discuss. No answer, left a message asking him to call me back.  Pt was set up on auto pap 11/03/19 but only started using it on 06/24/19.

## 2019-06-28 ENCOUNTER — Ambulatory Visit (INDEPENDENT_AMBULATORY_CARE_PROVIDER_SITE_OTHER): Payer: Managed Care, Other (non HMO) | Admitting: Neurology

## 2019-06-28 ENCOUNTER — Other Ambulatory Visit: Payer: Self-pay

## 2019-06-28 ENCOUNTER — Encounter: Payer: Self-pay | Admitting: Neurology

## 2019-06-28 VITALS — BP 160/100 | HR 91 | Ht 72.0 in | Wt 271.0 lb

## 2019-06-28 DIAGNOSIS — G4733 Obstructive sleep apnea (adult) (pediatric): Secondary | ICD-10-CM

## 2019-06-28 DIAGNOSIS — R635 Abnormal weight gain: Secondary | ICD-10-CM | POA: Diagnosis not present

## 2019-06-28 DIAGNOSIS — G4719 Other hypersomnia: Secondary | ICD-10-CM

## 2019-06-28 DIAGNOSIS — E669 Obesity, unspecified: Secondary | ICD-10-CM | POA: Diagnosis not present

## 2019-06-28 NOTE — Patient Instructions (Addendum)
We will set you up at home with a so called autoPAP machine for treatment of your obstructive sleep apnea. We will send the order to [facility].  Please use your autoPAP regularly. While your insurance requires that you use PAP at least 4 hours each night on 70% of the nights, I recommend, that you not skip any nights and use it throughout the night if you can. Getting used to PAP and staying with the treatment long term does take time and patience and discipline. Untreated obstructive sleep apnea when it is moderate to severe can have an adverse impact on cardiovascular health and raise her risk for heart disease, arrhythmias, hypertension, congestive heart failure, stroke and diabetes. Untreated obstructive sleep apnea causes sleep disruption, nonrestorative sleep, and sleep deprivation. This can have an impact on your day to day functioning and cause daytime sleepiness and impairment of cognitive function, memory loss, mood disturbance, and problems focussing. Using PAP regularly can improve these symptoms.  The address for [facility] is: [address] [phone].

## 2019-06-28 NOTE — Progress Notes (Signed)
Subjective:    Patient ID: Marcus Armstrong is a 54 y.o. male.  HPI    Interim history:   Marcus Armstrong is a 54 year old right-handed gentleman with an underlying medical history of hypertension and obesity, who presents for follow up consultation of his Obstructive sleep apnea.  The patient is unaccompanied today.  I first met him in a virtual visit on 09/07/2018, at which time he reported waking up with a sense of gasping for air and daytime somnolence.  He was advised to proceed with a sleep study.  He had a home sleep test on 10/06/2018 which indicated moderate obstructive sleep apnea with an AHI of 29/h, O2 nadir of 88%.  He was advised to start AutoPap therapy.  He agreed to start AutoPap and he was given a follow-up appointment to see the nurse practitioner in September 2020.  He did not return for follow-up.   Today, 06/28/2019: We looked up remote compliance data, it looks like there was 2 days of usage in the past 236 days from 11/03/2018 through 06/26/2019.  Set up date appears to be 11/03/2018.  We called his DME company, [facility] and we were told that he received an AutoPap machine.  He reports that he never received an AutoPap machine and that he is not on it.  He reports worsening symptoms including feeling of choking at night, daytime somnolence, sleep paralysis episodes.  He has had some weight gain.  He had back surgery in December under [facility].   The patient's allergies, current medications, family history, past medical history, past social history, past surgical history and problem list were reviewed and updated as appropriate.    Previously:   09/07/18: (He) presents for a virtual, video base appointment via [email or web address] for evaluation of his sleep disorder, in particular, concern for underlying obstructive sleep apnea.  The patient is unaccompanied today and joints via cell phone from his parked car. I am located in my office.  He is referred by his primary care nurse practitioner,  Marcus Armstrong. He reports snoring and excessive daytime somnolence.  He reports waking up with a sense of gasping for air.  His Epworth sleepiness score is 14 out of 24, fatigue severity score is 52 out of 63.  He reports a family history of OSA in his sister who has a CPAP machine.  He has had a variable sleep schedule, he is currently on short-term disability, has worked nights before, works in Psychologist, educational as a Furniture conservator/restorer.  When he worked nights he would not go to bed until noon sometimes, would be experiencing some sleepiness and even drowsiness at the wheel when driving home from work in the mornings, rise time was around 430 or 5 PM, currently he is in bed generally between 10 and 12 and rise time is generally between 530 and 6 AM.  Sometimes he goes back to sleep and has an alarm set for 7:50 AM.  He has a TV in his bedroom, lives with roommates, does not share his bedroom, has the TV on at night but it will go off on a timer typically.  He does not drink caffeine on a daily basis, occasional diet green tea or soda, most soda is non-caffeine-containing.  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  He very rarely smokes cigarettes  and denies any illicit drug use currently.  He does take ibuprofen on a regular basis and has taken meloxicam as well.  He denies any blood in the stool or recurrent cough.  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  He has nocturia on average once or twice per night, denies recurrent morning headaches, has no pets.  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  When he was in rehab he indicates significant weight gain of about 20 to 25 pounds.  He is trying to lose weight currently, has cut back on carbs.    His Past Medical  History Is Significant For: Past Medical History:  Diagnosis Date  . GERD (gastroesophageal reflux disease)   . History of kidney stones 2016  . Hypertension   . Neurofibromatosis ([place])   . Sleep apnea     His Past Surgical History Is Significant For: Past Surgical History:  Procedure Laterality Date  . CARDIAC CATHETERIZATION  02/27/2012   [facility]): Normal coronaries aside from mLAD myocardial bridging verus coronary spasm, LV > 65%  . COLON SURGERY     colonoscpy with polyp resection  . polyp removal      His Family History Is Significant For: Family History  Problem Relation Age of Onset  . Cancer Father 48       prostate    His Social History Is Significant For: Social History   Socioeconomic History  . Marital status: Divorced    Spouse name: Not on file  . Number of children: Not on file  . Years of education: Not on file  . Highest education level: Not on file  Occupational History  . Not on file  Tobacco Use  . Smoking status: Former Smoker    Packs/day: 0.25    Years: 30.00    Pack years: 7.50    Types: Cigarettes    Quit date: 06/06/2018    Years since quitting: 1.0  . Smokeless tobacco: Never Used  Substance and Sexual Activity  . Alcohol use: Yes    Comment: occasional beer  . 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 Sexual activity: Not on file  Other Topics Concern  . Not on file  Social History Narrative   Right handed    Social Determinants of Health   Financial Resource Strain:   . Difficulty of Paying Living Expenses:   Food Insecurity:   . Worried About Charity fundraiser in the Last Year:   . Arboriculturist in the Last Year:   Transportation Needs:   . Film/video editor (Medical):   Marland Kitchen Lack of Transportation (Non-Medical):   Physical Activity:   . Days of Exercise per Week:   . Minutes of Exercise per Session:   Stress:   . Feeling of  Stress :   Social Connections:   . Frequency of Communication with Friends and Family:   . Frequency of Social Gatherings with Friends and Family:   . Attends Religious Services:   . Active Member of Clubs or Organizations:   . Attends Archivist Meetings:   Marland Kitchen Marital Status:     His Allergies Are:  Allergies  Allergen Reactions  . Cat Hair Extract Hives, Shortness Of Breath, Swelling, Rash and Other (See Comments)  . Shellfish Allergy Shortness Of Breath, Itching and  Swelling    Eye ball swelling (when not eating fresh seafood, breathing problems)  :   His Current Medications Are:  Outpatient Encounter Medications as of 06/28/2019  Medication Sig  . amLODipine (NORVASC) 10 MG tablet Take 1 tablet (10 mg total) by mouth daily.  . Cannabidiol POWD Apply 1 application topically 3 (three) times daily as needed (pain.). CBD Pain Relief Roll-O  . cetirizine (ZYRTEC) 10 MG tablet Take 10 mg by mouth every other day.   . cyclobenzaprine (FLEXERIL) 10 MG tablet TAKE 1 TABLET(10 MG) BY MOUTH THREE TIMES DAILY AS NEEDED FOR MUSCLE SPASMS  . diazepam (VALIUM) 5 MG tablet Take 1-2 tablets (5-10 mg total) by mouth every 6 (six) hours as needed for muscle spasms.  . Diclofenac Sodium (PENNSAID) 2 % SOLN Place 1 application onto the skin 2 (two) times daily. (Patient taking differently: Place 1 application onto the skin 2 (two) times daily as needed (pain.). )  . fluticasone (FLONASE) 50 MCG/ACT nasal spray Place 1-2 sprays into both nostrils daily as needed (allergies.).   Marland Kitchen gabapentin (NEURONTIN) 300 MG capsule TAKE ONE CAPSULE BY MOUTH THREE TIMES DAILY  . hydrOXYzine (VISTARIL) 50 MG capsule Take 1 capsule (50 mg total) by mouth at bedtime as needed.  Marland Kitchen ibuprofen (ADVIL) 800 MG tablet Take 1 tablet (800 mg total) by mouth every 8 (eight) hours as needed. (Patient taking differently: Take 800 mg by mouth every 8 (eight) hours as needed (pain). )  . ketoconazole (NIZORAL) 2 % shampoo Apply  1 application topically 2 (two) times a week.  . losartan-hydrochlorothiazide (HYZAAR) 100-25 MG tablet Take 1 tablet by mouth daily.  . naproxen sodium (ALEVE) 220 MG tablet Take 440 mg by mouth 3 (three) times daily as needed (pain.).  Marland Kitchen omeprazole (PRILOSEC) 20 MG capsule Take 1 capsule (20 mg total) by mouth 2 (two) times daily before a meal. (Patient taking differently: Take 20 mg by mouth 2 (two) times daily as needed (acid reflux/indigestion.). )  . pregabalin (LYRICA) 75 MG capsule Take 75 mg by mouth 2 (two) times daily.  . Scar Treatment Products [facility]) GEL Apply to affected area BID   No facility-administered encounter medications on file as of 06/28/2019.  :  Review of Systems:  Out of a complete 14 point review of systems, all are reviewed and negative with the exception of these symptoms as listed below: Review of Systems  Neurological:       Pt presents today to discuss his osa. Pt reports that he was unable to start cpap because he was moving. He never got a cpap from [facility]. He is in a stable apartment now. He is complaining of his throat closing while he sleeps and of sleep paralysis.    Objective:  Neurological Exam  Physical Exam Physical Examination:   Vitals:   06/28/19 1457  BP: (!) 160/100  Pulse: 91    General Examination: The patient is a very pleasant 54 y.o. male in no acute distress. He appears well-developed and well-nourished and well groomed.   HEENT: Normocephalic, atraumatic, pupils are equal, round and reactive to light, extraocular tracking is good without limitation to gaze excursion or nystagmus noted. Hearing is grossly intact. Face is symmetric with normal facial animation. Speech is clear with no dysarthria noted. There is no hypophonia. There is no lip, neck/head, jaw or voice tremor. Neck is supple with full range of passive and active motion. There are no carotid bruits on auscultation. Oropharynx exam reveals: mild  mouth dryness, adequate  dental hygiene and marked airway crowding. Tongue protrudes centrally and palate elevates symmetrically. Neck 18.5 in.   Chest: Clear to auscultation without wheezing, rhonchi or crackles noted.  Heart: S1+S2+0, regular and normal without murmurs, rubs or gallops noted.   Abdomen: Soft, non-tender and non-distended with normal bowel sounds appreciated on auscultation.  Extremities: There is no pitting edema in the distal lower extremities bilaterally.   Skin: Warm and dry without trophic changes noted.   Musculoskeletal: exam reveals no obvious joint deformities, tenderness or joint swelling or erythema.   Neurologically:  Mental status: The patient is awake, alert and oriented in all 4 spheres. His immediate and remote memory, attention, language skills and fund of knowledge are appropriate. There is no evidence of aphasia, agnosia, apraxia or anomia. Speech is clear with normal prosody and enunciation. Thought process is linear. Mood is normal and affect is normal.  Cranial nerves II - XII are as described above under HEENT exam.  Motor exam: Normal bulk, strength and tone is noted. There is no tremor, Romberg is negative. Fine motor skills and coordination: grossly intact.  Cerebellar testing: No dysmetria or intention tremor. There is no truncal or gait ataxia.  Sensory exam: intact to light touch in the upper and lower extremities.  Gait, station and balance: He stands easily. No veering to one side is noted. No leaning to one side is noted. Posture is age-appropriate and stance is narrow based. Gait shows normal stride length and normal pace. No problems turning are noted. Tandem walk is unremarkable.                Assessment and Plan:   In summary, Marcus Armstrong is a very pleasant 54 y.o.-year old male with an underlying medical history of hypertension and obesity, who presents for follow up consultation of his Obstructive sleep apnea.  He was diagnosed with moderate obstructive sleep  apnea via home sleep test on 10/06/2018.  He was supposed to start AutoPap therapy but indicates that he never got a machine.  We called his DME company, [facility] and were told that he actually received the machine in July 2020.  He reports that he never had an AutoPap machine and that he is currently not using 1.  I advised patient to get started on treatment with AutoPap therapy.  I will send the order to a different DME company as per his request.  He is advised to be consistent with his AutoPap usage as moderate obstructive sleep apnea can cause cardiovascular complications long-term.  He is advised to be mindful of the compliance criteria per insurance guidelines.  He is advised to follow-up in 3 months to see the nurse practitioner.  He is encouraged to work on weight loss.  He reports that he gained weight in the past 3 or so months, especially since his back surgery.   I answered all his questions today and the patient was in agreement.  I spent 20 minutes in total face-to-face time and in reviewing records during pre-charting, more than 50% of which was spent in counseling and coordination of care, reviewing test results, reviewing medications and treatment regimen and/or in discussing or reviewing the diagnosis of OSA, the prognosis and treatment options. Pertinent laboratory and imaging test results that were available during this visit with the patient were reviewed by me and considered in my medical decision making (see chart for details).

## 2019-06-28 NOTE — Progress Notes (Signed)
Order for new auto pap sent to [facility] via community message. Confirmation received that the order transmitted was successful.

## 2019-06-28 NOTE — Telephone Encounter (Signed)
Pt presented today reporting that he never started cpap.  I called [facility], spoke to [place]. She reports that pt did receive a machine in July and they have the [employer] tracking number for when it was delivered. They also have documented multiple calls with the pt with insurance changes and billing.   Pt is adamant that he never received a machine.  I have asked [employer] to call the pt and I have asked pt to call [employer] to sort this out.

## 2019-06-29 ENCOUNTER — Encounter: Payer: Self-pay | Admitting: Neurology

## 2019-07-04 ENCOUNTER — Encounter: Payer: Self-pay | Admitting: Family Medicine

## 2019-07-11 ENCOUNTER — Other Ambulatory Visit: Payer: Self-pay

## 2019-07-11 ENCOUNTER — Encounter: Payer: Self-pay | Admitting: Physical Therapy

## 2019-07-11 ENCOUNTER — Ambulatory Visit: Payer: Managed Care, Other (non HMO) | Attending: Neurosurgery | Admitting: Physical Therapy

## 2019-07-11 DIAGNOSIS — M62830 Muscle spasm of back: Secondary | ICD-10-CM

## 2019-07-11 DIAGNOSIS — M545 Low back pain, unspecified: Secondary | ICD-10-CM

## 2019-07-11 DIAGNOSIS — M5441 Lumbago with sciatica, right side: Secondary | ICD-10-CM | POA: Diagnosis present

## 2019-07-11 DIAGNOSIS — G8929 Other chronic pain: Secondary | ICD-10-CM | POA: Insufficient documentation

## 2019-07-11 DIAGNOSIS — R262 Difficulty in walking, not elsewhere classified: Secondary | ICD-10-CM | POA: Diagnosis present

## 2019-07-11 NOTE — Telephone Encounter (Signed)
Received this notice from [facility]: "03/30 called pt to go over financials and scheduling, pt advised he just got a machine through [facility] and was just having some mask issues, pt advised he would not be needing anything from [place] at this time. I have voided out this order, just wanted to make you all aware."

## 2019-07-11 NOTE — Therapy (Signed)
[place], [name], [address] Phone: [phone]   Fax:  [phone]  Physical Therapy Evaluation  Patient Details  Name: Marcus Armstrong MRN: [id number] Date of Birth: 1965/07/24 Referring Provider (PT): Dr Earnie Larsson     Encounter Date: 07/11/2019  PT End of Session - 07/11/19 0945    Visit Number  1    Number of Visits  12    Date for PT Re-Evaluation  08/22/19    Authorization Type  [employer] 20% after deductable    PT Start Time  0930    PT Stop Time  1015    PT Time Calculation (min)  45 min    Activity Tolerance  Patient tolerated treatment well    Behavior During Therapy  [facility] for tasks assessed/performed       Past Medical History:  Diagnosis Date  . GERD (gastroesophageal reflux disease)   . History of kidney stones 2016  . Hypertension   . Neurofibromatosis ([place])   . Sleep apnea     Past Surgical History:  Procedure Laterality Date  . CARDIAC CATHETERIZATION  02/27/2012   [facility]): Normal coronaries aside from mLAD myocardial bridging verus coronary spasm, LV > 65%  . COLON SURGERY     colonoscpy with polyp resection  . polyp removal      There were no vitals filed for this visit.   Subjective Assessment - 07/11/19 0936    Subjective  Patient has a long history of lower back pain. He had therapy loast year which helepd but he ended up having to have lumbar fusuion. The fusion improved his pain but he still has ddifficulty standing and walking distances. He was also shooting pool recently and felt a strain on the right side of his abdomen and has pain inbto his rightr testicle.    Limitations  Standing;Walking   stading at the sink   How long can you stand comfortably?  limited time whie perfroming daily tasks    How long can you walk comfortably?  limited community distances. Can have cramping in the right leg.    Currently in Pain?  Yes    Pain Score  4     Pain Location   Back    Pain Orientation  Right    Pain Descriptors / Indicators  Aching;Shooting    Pain Type  Chronic pain    Pain Radiating Towards  pain can radiate into the right testicle( new since abdonminal strain). Pain can raidatie into the hip and down the front of the leg.    Pain Onset  1 to 4 weeks ago    Pain Frequency  Constant    Aggravating Factors   leaning forward, standing, walking    Pain Relieving Factors  ibuprofinm    Effect of Pain on Daily Activities  difficulty sleeping and performing ADL's         [facility] PT Assessment - 07/11/19 0001      Assessment   Medical Diagnosis  Low Back Pain/ lumbar Fusion     Referring Provider (PT)  Dr Earnie Larsson      Onset Date/Surgical Date  --   Long standing back pain Surgery 12/07    Hand Dominance  Right    Next MD Visit  3 months    Prior Therapy  in 10/2018       Precautions   Precautions  None      Restrictions   Weight Bearing  Restrictions  No      Balance Screen   Has the patient fallen in the past 6 months  No    Has the patient had a decrease in activity level because of a fear of falling?   No    Is the patient reluctant to leave their home because of a fear of falling?   No      Home Environment   Additional Comments  Steps into thtre house. 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     Leisure  would like to be able to work out       [employer]   Overall Cognitive Status  Within Functional Limits for tasks assessed    Attention  Focused    Focused Attention  Appears intact    Memory  Appears intact    Awareness  Appears intact    Problem Solving  Appears intact      Observation/Other Assessments   Focus on Therapeutic Outcomes (FOTO)   75% limitation       Sensation   Light Touch  Appears Intact    Additional Comments  pain radiates into the thigh and into the right hip       Coordination    Gross Motor Movements are Fluid and Coordinated  Yes    Fine Motor Movements are Fluid and Coordinated  Yes      Posture/Postural Control   Posture Comments  rounded shoulder/ forward head       ROM / Strength   AROM / PROM / Strength  AROM;PROM;Strength      AROM   AROM Assessment Site  Lumbar    Lumbar Flexion  20   with pain    Lumbar Extension  5    with pain    Lumbar - Right Rotation  limited 25%     Lumbar - Left Rotation  limited 100% with pain       PROM   Overall PROM Comments  pain with passive right hip flexion      Strength   Strength Assessment Site  Hip    Right/Left Hip  Right;Left    Right Hip Flexion  3+/5    Right Hip ABduction  4/5    Right Hip ADduction  4+/5    Left Hip Flexion  4+/5    Left Hip ABduction  4+/5    Left Hip ADduction  5/5      Flexibility   Soft Tissue Assessment /Muscle Length  --   90/90 hamstring limited 35% bilateral     Palpation   Palpation comment  spasming in bliateral lumbar paraspinals and right gluteal       Ambulation/Gait   Gait Comments  decreased hip rotation with gait; lateral movement left and right                 Objective measurements completed on examination: See above findings.      [facility] Adult PT Treatment/Exercise - 07/11/19 0001      Lumbar Exercises: Stretches   Active Hamstring Stretch Limitations  hamstring stretch in very limited range with good posture 2x20 sec hold     Other Lumbar Stretch Exercise  tennis ball trigger point rlease to gluteal and lumbar spine       Lumbar Exercises: Seated   Other Seated  Lumbar Exercises  seated clamshell 2x10 yellow                PT Short Term Goals - 07/11/19 1050      PT SHORT TERM GOAL #1   Title  Patient will increase lumbar flexion by 20 degrees    Baseline  20 degrees    Time  3    Period  Weeks    Status  New    Target Date  08/01/19      PT SHORT TERM GOAL #2   Title  Patient will demonstrate 4+/5 right LE strength     Time  3    Period  Weeks    Status  New    Target Date  08/01/19      PT SHORT TERM GOAL #3   Title  Patient will be independent with basic stretching and strengthening program    Time  3    Period  Weeks    Status  New    Target Date  08/01/19        PT Long Term Goals - 07/11/19 1317      PT LONG TERM GOAL #1   Title  Patient will stand for 45 min without self report of increased lower back pain and spasming in order to perfrom ADL's    Time  6    Period  Weeks    Status  New    Target Date  08/22/19      PT LONG TERM GOAL #2   Title  Patient will sleep through the night without increased lower back pain.    Time  6    Period  Weeks    Status  New    Target Date  08/22/19      PT LONG TERM GOAL #3   Title  Patient will deomnstrate a 55% limitation on FOTO    Time  6    Period  Weeks    Status  New    Target Date  08/22/19             Plan - 07/11/19 0950    Clinical Impression Statement  Patient is a 54 year old male with a longstanding history of lower back pain. He had a L4/L5 lumbar fusion on 07/11/2019. Since that point he has improved ability to stand and walk but he is still limited. He feels when he stands or walks toolong he has spasming and pain in his right hip. He has increased pain wheen he leans ofver the sink. 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 Hen has limited bilateral hip strength and limited lumbar flexion. He has spasming in his right gluteals and bilateral paraspinals. He would benefit from skilled therapy to improve his ability to perfrom ADL's and tro sleep. He would like to return to work if able.    Personal Factors and Comorbidities  Comorbidity 1;Comorbidity 2;Fitness    Comorbidities  left menical tear; right IT band syndrome ( history ), 20 lb increase in weight since surgery    Examination-Activity Limitations  Bend;Carry;Lift;Locomotion Level;Reach Overhead;Squat    Examination-Participation  Restrictions  Cleaning;Community Activity;Yard Work    [employer] officer  Evolving/Moderate complexity    Clinical Decision Making  Moderate    Rehab Potential  Good    PT Frequency  2x / week    PT Duration  4 weeks    PT Treatment/Interventions  ADLs/Self  Care Home Management;Electrical Stimulation;Iontophoresis 4mg /ml Dexamethasone;Moist Heat;Traction;Ultrasound;DME Instruction;Stair training;Functional mobility training;Gait training;Therapeutic activities;Therapeutic exercise;Neuromuscular re-education;Patient/family education;Manual techniques;Dry needling;Passive range of motion;Taping    PT Next Visit Plan  soft tissue mobilization to lumbar spine. Will likely need to be perfromed in side lying since he has difficulty lying prone. Consider LAD if he can toerate. Side lying spine mobilization if able. patient was iven seated clams shelles. Review abdominal breathing; progress exercises as tolerated. Consider standing exercises. Modlaities PRN; review stretches; add glute stretch when able    PT Home Exercise Plan  tennis ball trigger point release; seated hamstring stretch with minimal range, seated t-band clamshell    Consulted and Agree with Plan of Care  Patient       Patient will benefit from skilled therapeutic intervention in order to improve the following deficits and impairments:  Abnormal gait, Decreased mobility, Decreased strength, Postural dysfunction, Pain, Increased muscle spasms, Decreased activity tolerance, Decreased endurance, Increased fascial restricitons, Difficulty walking, Decreased range of motion  Visit Diagnosis: Chronic bilateral low back pain without sciatica  Difficulty in walking, not elsewhere classified  Muscle spasm of back     Problem List Patient Active Problem List   Diagnosis Date Noted  . Lumbar foraminal stenosis 03/14/2019  . Congenital spinal stenosis of lumbar region 10/28/2018  . 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 Torn meniscus  09/23/2018  . It band syndrome, right 08/18/2018  . Personal history of colonic polyps 07/28/2018  . Essential hypertension 07/28/2018  . Gastroesophageal reflux disease 07/28/2018  . Inflammation of right sacroiliac joint ([place]) 05/08/2016  . 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 Pain in left knee 05/15/2015    Carney Living  PT DPT  07/11/2019, 1:50 PM  [facility] [address] [place], [name], [address] Phone: [phone]   Fax:  [phone]  Name: Tim Depiano MRN: [id number] Date of Birth: 10/16/1965

## 2019-07-19 ENCOUNTER — Ambulatory Visit: Payer: Managed Care, Other (non HMO) | Admitting: Physical Therapy

## 2019-07-22 ENCOUNTER — Ambulatory Visit: Payer: Managed Care, Other (non HMO) | Admitting: Physical Therapy

## 2019-07-22 ENCOUNTER — Other Ambulatory Visit: Payer: Self-pay

## 2019-07-22 ENCOUNTER — Encounter: Payer: Self-pay | Admitting: Physical Therapy

## 2019-07-22 DIAGNOSIS — R262 Difficulty in walking, not elsewhere classified: Secondary | ICD-10-CM

## 2019-07-22 DIAGNOSIS — M545 Low back pain, unspecified: Secondary | ICD-10-CM

## 2019-07-22 DIAGNOSIS — M62830 Muscle spasm of back: Secondary | ICD-10-CM

## 2019-07-22 DIAGNOSIS — G8929 Other chronic pain: Secondary | ICD-10-CM

## 2019-07-22 NOTE — Therapy (Signed)
[place], [name], [address] Phone: [phone]   Fax:  [phone]  Physical Therapy Treatment  Patient Details  Name: Marcus Armstrong MRN: [id number] Date of Birth: [birth year] Referring Provider (PT): Dr Earnie Larsson     Encounter Date: 07/22/2019  PT End of Session - 07/22/19 0853    Visit Number  2    Number of Visits  12    Date for PT Re-Evaluation  08/22/19    Authorization Type  [employer] 20% after deductable    PT Start Time  0846    PT Stop Time  0930    PT Time Calculation (min)  44 min    Activity Tolerance  Patient tolerated treatment well    Behavior During Therapy  [facility] for tasks assessed/performed       Past Medical History:  Diagnosis Date  . GERD (gastroesophageal reflux disease)   . History of kidney stones 2016  . Hypertension   . Neurofibromatosis ([place])   . Sleep apnea     Past Surgical History:  Procedure Laterality Date  . CARDIAC CATHETERIZATION  02/27/2012   [facility]): Normal coronaries aside from mLAD myocardial bridging verus coronary spasm, LV > 65%  . COLON SURGERY     colonoscpy with polyp resection  . polyp removal      There were no vitals filed for this visit.  Subjective Assessment - 07/22/19 0850    Subjective  Patient reports his back is very stiff when he wakes up in the morning. he has a lot of tightness at night. His right knee has been bothering him. He has been using Pensaid which has helped . He has no pain this morning but has pain ewhen he stands and walks. he has been having low back pain when he is going up and down steps.    Limitations  Standing;Walking    How long can you stand comfortably?  limited time whie perfroming daily tasks    How long can you walk comfortably?  limited community distances. Can have cramping in the right leg.    Currently in Pain?  No/denies                       [facility] Adult PT Treatment/Exercise -  07/22/19 0001      Lumbar Exercises: Stretches   Active Hamstring Stretch Limitations  hamstring stretch in very limited range with good posture 2x20 sec hold     Piriformis Stretch  2 reps;20 seconds      Lumbar Exercises: Standing   Other Standing Lumbar Exercises  2x10 red with breathing       Lumbar Exercises: Seated   Other Seated Lumbar Exercises  seated clamshell 2x10 yellow       Manual Therapy   Manual Therapy  Soft tissue mobilization;Joint mobilization    Manual therapy comments  LAD 2x 45 sec hold each leg with g    Joint Mobilization  gentle PA mobilization to segments not fused     Soft tissue mobilization  IASTYM to lumbar spine              PT Education - 07/22/19 0853    Education Details  reviewed cxore strengtheing    Person(s) Educated  Patient    Methods  Explanation;Demonstration;Tactile cues;Verbal cues    Comprehension  Verbalized understanding;Returned demonstration;Verbal cues required;Tactile cues required       PT Short Term Goals - 07/22/19  [place] #1   Title  Patient will increase lumbar flexion by 20 degrees    Baseline  20 degrees    Time  3    Period  Weeks    Status  On-going    Target Date  08/01/19      PT SHORT TERM GOAL #2   Title  Patient will demonstrate 4+/5 right LE strength    Time  3    Period  Weeks    Status  On-going    Target Date  08/01/19      PT SHORT TERM GOAL #3   Title  Patient will be independent with basic stretching and strengthening program    Time  3    Period  Weeks    Status  On-going        PT Long Term Goals - 07/11/19 1317      PT LONG TERM GOAL #1   Title  Patient will stand for 45 min without self report of increased lower back pain and spasming in order to perfrom ADL's    Time  6    Period  Weeks    Status  New    Target Date  08/22/19      PT LONG TERM GOAL #2   Title  Patient will sleep through the night without increased lower back pain.    Time  6     Period  Weeks    Status  New    Target Date  08/22/19      PT LONG TERM GOAL #3   Title  Patient will deomnstrate a 55% limitation on FOTO    Time  6    Period  Weeks    Status  New    Target Date  08/22/19            Plan - 07/22/19 0951    Clinical Impression Statement  Therapy focused on manual therapy to the lumbar spine and upper gluteals to decrease muslce tightness and movement restrictions. Patient was also given standing core exercises to work on. After standing exercise he had pian in his right lateral hip and right lower back. He was given a piriformis/gluteal stretch which he reported reduced his syymptoms. Therapy will continue to progress ther-ex as tolerated. He has right knee pain and left groin pain that may be limiting but caused him no issue today.    Personal Factors and Comorbidities  Comorbidity 1;Comorbidity 2;Fitness    Comorbidities  left menical tear; right IT band syndrome ( history ), 20 lb increase in weight since surgery    Examination-Activity Limitations  Bend;Carry;Lift;Locomotion Level;Reach Overhead;Squat    Examination-Participation Restrictions  Cleaning;Community Activity;Yard Work    [employer] officer  Evolving/Moderate complexity    Clinical Decision Making  Moderate    Rehab Potential  Good    PT Frequency  2x / week    PT Duration  4 weeks    PT Treatment/Interventions  ADLs/Self Care Home Management;Electrical Stimulation;Iontophoresis 4mg /ml Dexamethasone;Moist Heat;Traction;Ultrasound;DME Instruction;Stair training;Functional mobility training;Gait training;Therapeutic activities;Therapeutic exercise;Neuromuscular re-education;Patient/family education;Manual techniques;Dry needling;Passive range of motion;Taping    PT Next Visit Plan  soft tissue mobilization to lumbar spine. Will likely need to be perfromed in side lying since he has difficulty lying prone. Consider LAD if he can toerate. Side lying spine mobilization if  able. patient was iven seated clams shelles. Review abdominal breathing; progress exercises as tolerated. Consider standing exercises. Modlaities PRN;  review stretches; add glute stretch when able    PT Home Exercise Plan  tennis ball trigger point release; seated hamstring stretch with minimal range, seated t-band clamshell    Consulted and Agree with Plan of Care  Patient       Patient will benefit from skilled therapeutic intervention in order to improve the following deficits and impairments:  Abnormal gait, Decreased mobility, Decreased strength, Postural dysfunction, Pain, Increased muscle spasms, Decreased activity tolerance, Decreased endurance, Increased fascial restricitons, Difficulty walking, Decreased range of motion  Visit Diagnosis: Chronic bilateral low back pain without sciatica  Difficulty in walking, not elsewhere classified  Muscle spasm of back  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 Lumbar foraminal stenosis 03/14/2019  . Congenital spinal stenosis of lumbar region 10/28/2018  . 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 Torn meniscus 09/23/2018  . It band syndrome, right 08/18/2018  . Personal history of colonic polyps 07/28/2018  . Essential hypertension 07/28/2018  . Gastroesophageal reflux disease 07/28/2018  . Inflammation of right sacroiliac joint ([place]) 05/08/2016  . 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 Pain in left knee 05/15/2015    Carney Living PT DPT  07/22/2019, 11:43 AM  [facility] [address] [place], [name], [address] Phone: [phone]   Fax:  [phone]  Name: Marcus Armstrong MRN: [id number] Date of Birth: [birth year]

## 2019-07-26 ENCOUNTER — Encounter: Payer: Self-pay | Admitting: Physical Therapy

## 2019-07-26 ENCOUNTER — Ambulatory Visit: Payer: Managed Care, Other (non HMO) | Admitting: Physical Therapy

## 2019-07-26 ENCOUNTER — Other Ambulatory Visit: Payer: Self-pay

## 2019-07-26 DIAGNOSIS — M545 Low back pain, unspecified: Secondary | ICD-10-CM

## 2019-07-26 DIAGNOSIS — G8929 Other chronic pain: Secondary | ICD-10-CM

## 2019-07-26 DIAGNOSIS — R262 Difficulty in walking, not elsewhere classified: Secondary | ICD-10-CM

## 2019-07-26 DIAGNOSIS — M62830 Muscle spasm of back: Secondary | ICD-10-CM

## 2019-07-26 NOTE — Therapy (Signed)
[place], [name], [address] Phone: [phone]   Fax:  [phone]  Physical Therapy Treatment  Patient Details  Name: Marcus Armstrong MRN: [id number] Date of Birth: 29-May-1965 Referring Provider (PT): Dr Earnie Larsson     Encounter Date: 07/26/2019  PT End of Session - 07/26/19 0935    Visit Number  3    Number of Visits  12    Date for PT Re-Evaluation  08/22/19    Authorization Type  [employer] 20% after deductable    PT Start Time  0930    PT Stop Time  1015    PT Time Calculation (min)  45 min       Past Medical History:  Diagnosis Date  . GERD (gastroesophageal reflux disease)   . History of kidney stones 2016  . Hypertension   . Neurofibromatosis ([place])   . Sleep apnea     Past Surgical History:  Procedure Laterality Date  . CARDIAC CATHETERIZATION  02/27/2012   [facility]): Normal coronaries aside from mLAD myocardial bridging verus coronary spasm, LV > 65%  . COLON SURGERY     colonoscpy with polyp resection  . polyp removal      There were no vitals filed for this visit.  Subjective Assessment - 07/26/19 0934    Subjective  4/10 pain now, was 6/1o this morning prior to getting moving.    Currently in Pain?  Yes    Pain Score  4     Pain Location  Back    Pain Orientation  Right;Left;Lower    Pain Descriptors / Indicators  Aching   stiffness   Pain Type  Chronic pain    Aggravating Factors   leaning forward, standing, walking    Pain Relieving Factors  meds                       [facility] Adult PT Treatment/Exercise - 07/26/19 0001      Lumbar Exercises: Stretches   Passive Hamstring Stretch  3 reps;30 seconds    Lower Trunk Rotation  10 seconds   cues to brace core for stabilization   Lower Trunk Rotation Limitations  10 reps     Hip Flexor Stretch  Right;2 reps;30 seconds    Piriformis Stretch  2 reps;20 seconds    Other Lumbar Stretch Exercise  Modified  childs pose physioball roll outs       Lumbar Exercises: Seated   Sit to Stand  5 reps    Sit to Stand Limitations  cues for pelvic floor engagement     Other Seated Lumbar Exercises  Pelvic floor contraction with Seated posterior pelvic tilits , added alternating LAQ -felt pain in groin to Disc.       Lumbar Exercises: Supine   Ab Set  10 reps    AB Set Limitations  pelvic floor contractions     Pelvic Tilt  10 reps    Glut Set  10 reps    Bridge Limitations  small rom working on stabilzing core/pelvic floor with bed mobility     Large Ball Oblique Isometric  10 reps    Large Ball Oblique Isometric Limitations  cues for breathing                PT Short Term Goals - 07/22/19 1141      PT SHORT TERM GOAL #1   Title  Patient will increase lumbar flexion by 20  degrees    Baseline  20 degrees    Time  3    Period  Weeks    Status  On-going    Target Date  08/01/19      PT SHORT TERM GOAL #2   Title  Patient will demonstrate 4+/5 right LE strength    Time  3    Period  Weeks    Status  On-going    Target Date  08/01/19      PT SHORT TERM GOAL #3   Title  Patient will be independent with basic stretching and strengthening program    Time  3    Period  Weeks    Status  On-going        PT Long Term Goals - 07/11/19 1317      PT LONG TERM GOAL #1   Title  Patient will stand for 45 min without self report of increased lower back pain and spasming in order to perfrom ADL's    Time  6    Period  Weeks    Status  New    Target Date  08/22/19      PT LONG TERM GOAL #2   Title  Patient will sleep through the night without increased lower back pain.    Time  6    Period  Weeks    Status  New    Target Date  08/22/19      PT LONG TERM GOAL #3   Title  Patient will deomnstrate a 55% limitation on FOTO    Time  6    Period  Weeks    Status  New    Target Date  08/22/19            Plan - 07/26/19 1040    Clinical Impression Statement  Pt reports  difficulty with stability. He feels like the lumbar hardware is moving seperately from the rest of his body.He has pain in low abdominals and right groin specifically with bed mobility. Worked on stabilization with cues to recruit pelvic floor. He noted significant reduction in pan with transitions and bed mobilty with pelvic floor activation. Worked on core etabl in sitting and supine. He flet good at end of session.    PT Next Visit Plan  continu pelvic floor strengthening; soft tissue mobilization to lumbar spine. Will likely need to be perfromed in side lying since he has difficulty lying prone. Consider LAD if he can toerate. Side lying spine mobilization if able. patient was iven seated clams shelles. Review abdominal breathing; progress exercises as tolerated. Consider standing exercises. Modlaities PRN; review stretches; add glute stretch when able    PT Home Exercise Plan  tennis ball trigger point release; seated hamstring stretch with minimal range, seated t-band clamshell, verbal cues to recruit pelvic floor with transition and stabilization exercises.       Patient will benefit from skilled therapeutic intervention in order to improve the following deficits and impairments:  Abnormal gait, Decreased mobility, Decreased strength, Postural dysfunction, Pain, Increased muscle spasms, Decreased activity tolerance, Decreased endurance, Increased fascial restricitons, Difficulty walking, Decreased range of motion  Visit Diagnosis: Chronic bilateral low back pain without sciatica  Difficulty in walking, not elsewhere classified  Muscle spasm of back  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 Lumbar foraminal stenosis 03/14/2019  . Congenital spinal stenosis of lumbar region 10/28/2018  .  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 Torn meniscus 09/23/2018  . It band syndrome, right 08/18/2018  . Personal history  of colonic polyps 07/28/2018  . Essential hypertension 07/28/2018  . Gastroesophageal reflux disease 07/28/2018  . Inflammation of right sacroiliac joint ([place]) 05/08/2016  . 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 Pain in left knee 05/15/2015    Dorene Ar, [place] 07/26/2019, 11:29 AM  [place], [name], [address] Phone: [phone]   Fax:  [phone]  Name: Marcus Armstrong MRN: [id number] Date of Birth: 1965-11-20

## 2019-07-28 ENCOUNTER — Other Ambulatory Visit: Payer: Self-pay

## 2019-07-28 ENCOUNTER — Ambulatory Visit: Payer: Managed Care, Other (non HMO) | Admitting: Physical Therapy

## 2019-07-28 ENCOUNTER — Encounter: Payer: Self-pay | Admitting: Physical Therapy

## 2019-07-28 DIAGNOSIS — G8929 Other chronic pain: Secondary | ICD-10-CM

## 2019-07-28 DIAGNOSIS — M62830 Muscle spasm of back: Secondary | ICD-10-CM

## 2019-07-28 DIAGNOSIS — R262 Difficulty in walking, not elsewhere classified: Secondary | ICD-10-CM

## 2019-07-28 DIAGNOSIS — M545 Low back pain, unspecified: Secondary | ICD-10-CM

## 2019-07-28 NOTE — Therapy (Signed)
[place], [name], [address] Phone: [phone]   Fax:  [phone]  Physical Therapy Treatment  Patient Details  Name: Marcus Armstrong MRN: [id number] Date of Birth: Oct 05, 1965 Referring Provider (PT): Dr Earnie Larsson     Encounter Date: 07/28/2019  PT End of Session - 07/28/19 0723    Visit Number  4    Number of Visits  12    Date for PT Re-Evaluation  08/22/19    PT Start Time  [id number]    PT Stop Time  0815    PT Time Calculation (min)  58 min       Past Medical History:  Diagnosis Date  . GERD (gastroesophageal reflux disease)   . History of kidney stones 2016  . Hypertension   . Neurofibromatosis ([place])   . Sleep apnea     Past Surgical History:  Procedure Laterality Date  . CARDIAC CATHETERIZATION  02/27/2012   [facility]): Normal coronaries aside from mLAD myocardial bridging verus coronary spasm, LV > 65%  . COLON SURGERY     colonoscpy with polyp resection  . polyp removal      There were no vitals filed for this visit.  Subjective Assessment - 07/28/19 0723    Subjective  3/10 pain today.                       [place] Adult PT Treatment/Exercise - 07/28/19 0001      Lumbar Exercises: Stretches   Passive Hamstring Stretch  3 reps;30 seconds    Lower Trunk Rotation  10 seconds   cues to brace core for stabilization   Lower Trunk Rotation Limitations  10 reps     Hip Flexor Stretch  Right;2 reps;30 seconds    Piriformis Stretch  2 reps;20 seconds    Other Lumbar Stretch Exercise  Modified childs pose physioball roll outs     Other Lumbar Stretch Exercise  LTR with foot on ball- cues for core bracing       Lumbar Exercises: Aerobic   Nustep  L5 UE/LE x 7 minutes       Lumbar Exercises: Standing   Row  20 reps    Theraband Level (Row)  Level 3 (Green)    Shoulder Extension  20 reps    Theraband Level (Shoulder Extension)  Level 3 (Green)    Other Standing  Lumbar Exercises  palloff press double green band x 10       Lumbar Exercises: Supine   Pelvic Tilt  10 reps    Bridge Limitations  small rom working on stabilzing core/pelvic floor with bed mobility     Large Ball Oblique Isometric  15 reps    Large Ball Oblique Isometric Limitations  cues for breathing     Other Supine Lumbar Exercises  feet on green physioball -rollouts with abdominal draw ins, glut/ab sets x 10 each       Modalities   Modalities  Moist Heat      Moist Heat Therapy   Number Minutes Moist Heat  15 Minutes    Moist Heat Location  Lumbar Spine               PT Short Term Goals - 07/22/19 1141      PT SHORT TERM GOAL #1   Title  Patient will increase lumbar flexion by 20 degrees    Baseline  20 degrees    Time  3  Period  Weeks    Status  On-going    Target Date  08/01/19      PT SHORT TERM GOAL #2   Title  Patient will demonstrate 4+/5 right LE strength    Time  3    Period  Weeks    Status  On-going    Target Date  08/01/19      PT SHORT TERM GOAL #3   Title  Patient will be independent with basic stretching and strengthening program    Time  3    Period  Weeks    Status  On-going        PT Long Term Goals - 07/11/19 1317      PT LONG TERM GOAL #1   Title  Patient will stand for 45 min without self report of increased lower back pain and spasming in order to perfrom ADL's    Time  6    Period  Weeks    Status  New    Target Date  08/22/19      PT LONG TERM GOAL #2   Title  Patient will sleep through the night without increased lower back pain.    Time  6    Period  Weeks    Status  New    Target Date  08/22/19      PT LONG TERM GOAL #3   Title  Patient will deomnstrate a 55% limitation on FOTO    Time  6    Period  Weeks    Status  New    Target Date  08/22/19            Plan - 07/28/19 0749    Clinical Impression Statement  He reports doing well Did well after last session. Continued with core stabilization in  supine and standing. He reports pelvic floor activation has been helpful with decreasing pain with ADLs and laundry duties.    PT Next Visit Plan  continue pelvic floor strengthening; soft tissue mobilization to lumbar spine. Will likely need to be perfromed in side lying since he has difficulty lying prone. Consider LAD if he can toerate. Side lying spine mobilization if able. patient was iven seated clams shelles. Review abdominal breathing; progress exercises as tolerated. Consider standing exercises. Modlaities PRN; review stretches; add glute stretch when able    PT Home Exercise Plan  tennis ball trigger point release; seated hamstring stretch with minimal range, seated t-band clamshell, verbal cues to recruit pelvic floor with transition and stabilization exercises.       Patient will benefit from skilled therapeutic intervention in order to improve the following deficits and impairments:  Abnormal gait, Decreased mobility, Decreased strength, Postural dysfunction, Pain, Increased muscle spasms, Decreased activity tolerance, Decreased endurance, Increased fascial restricitons, Difficulty walking, Decreased range of motion  Visit Diagnosis: Chronic bilateral low back pain without sciatica  Difficulty in walking, not elsewhere classified  Muscle spasm of back  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 Lumbar foraminal stenosis 03/14/2019  . Congenital spinal stenosis of lumbar region 10/28/2018  . 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 Torn meniscus 09/23/2018  . It band syndrome, right 08/18/2018  . Personal history of colonic polyps 07/28/2018  . Essential hypertension 07/28/2018  . Gastroesophageal reflux disease 07/28/2018  . Inflammation of right sacroiliac joint ([place]) 05/08/2016  . 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 Pain  in left knee 05/15/2015    Dorene Ar,  PTA 07/28/2019, 8:51 AM  [place], [name], [address] Phone: [phone]   Fax:  [phone]  Name: Marcus Armstrong MRN: [id number] Date of Birth: 1966-03-01

## 2019-07-29 ENCOUNTER — Encounter: Payer: Self-pay | Admitting: Family Medicine

## 2019-08-03 ENCOUNTER — Encounter: Payer: Self-pay | Admitting: Physical Therapy

## 2019-08-03 ENCOUNTER — Ambulatory Visit: Payer: Managed Care, Other (non HMO) | Admitting: Physical Therapy

## 2019-08-03 ENCOUNTER — Other Ambulatory Visit: Payer: Self-pay

## 2019-08-03 DIAGNOSIS — G8929 Other chronic pain: Secondary | ICD-10-CM

## 2019-08-03 DIAGNOSIS — M545 Low back pain, unspecified: Secondary | ICD-10-CM

## 2019-08-03 DIAGNOSIS — R262 Difficulty in walking, not elsewhere classified: Secondary | ICD-10-CM

## 2019-08-03 DIAGNOSIS — M62830 Muscle spasm of back: Secondary | ICD-10-CM

## 2019-08-03 NOTE — Therapy (Signed)
[place], [name], [address] Phone: [phone]   Fax:  [phone]  Physical Therapy Treatment  Patient Details  Name: Marcus Armstrong MRN: [id number] Date of Birth: 08/19/1965 Referring Provider (PT): Dr Earnie Larsson     Encounter Date: 08/03/2019  PT End of Session - 08/03/19 1023    Visit Number  5    Number of Visits  12    Date for PT Re-Evaluation  08/22/19    Authorization Type  [employer] 20% after deductable    PT Start Time  1018    PT Stop Time  1100    PT Time Calculation (min)  42 min       Past Medical History:  Diagnosis Date  . GERD (gastroesophageal reflux disease)   . History of kidney stones 2016  . Hypertension   . Neurofibromatosis ([place])   . Sleep apnea     Past Surgical History:  Procedure Laterality Date  . CARDIAC CATHETERIZATION  02/27/2012   [facility]): Normal coronaries aside from mLAD myocardial bridging verus coronary spasm, LV > 65%  . COLON SURGERY     colonoscpy with polyp resection  . polyp removal      There were no vitals filed for this visit.  Subjective Assessment - 08/03/19 1021    Subjective  Its a good day.    Currently in Pain?  Yes    Pain Score  2     Pain Location  Back    Pain Orientation  Right;Left;Lower    Pain Descriptors / Indicators  Aching    Aggravating Factors   leaning forward , standing walking    Pain Relieving Factors  meds, stretches         [facility] PT Assessment - 08/03/19 0001      Strength   Right Hip Flexion  4/5    Right Hip ABduction  3/5    Left Hip Flexion  4+/5                   [facility] Adult PT Treatment/Exercise - 08/03/19 0001      Lumbar Exercises: Stretches   Lower Trunk Rotation Limitations  open books as tolerated    Other Lumbar Stretch Exercise  Right QL stretch in sidelying       Lumbar Exercises: Aerobic   Nustep  L7 UE/LE x 7 minutes       Lumbar Exercises: Standing   Row  20 reps     Theraband Level (Row)  Level 3 (Green)    Shoulder Extension  20 reps    Theraband Level (Shoulder Extension)  Level 3 (Green)    Other Standing Lumbar Exercises  palloff press double green band x 10       Lumbar Exercises: Seated   Sit to Stand  10 reps      Lumbar Exercises: Supine   Large Ball Oblique Isometric  15 reps    Large Ball Oblique Isometric Limitations  cues for breathing       Lumbar Exercises: Sidelying   Clam  20 reps    Hip Abduction  10 reps    Hip Abduction Limitations  dificult on right                PT Short Term Goals - 07/22/19 1141      PT SHORT TERM GOAL #1   Title  Patient will increase lumbar flexion by 20 degrees  Baseline  20 degrees    Time  3    Period  Weeks    Status  On-going    Target Date  08/01/19      PT SHORT TERM GOAL #2   Title  Patient will demonstrate 4+/5 right LE strength    Time  3    Period  Weeks    Status  On-going    Target Date  08/01/19      PT SHORT TERM GOAL #3   Title  Patient will be independent with basic stretching and strengthening program    Time  3    Period  Weeks    Status  On-going        PT Long Term Goals - 07/11/19 1317      PT LONG TERM GOAL #1   Title  Patient will stand for 45 min without self report of increased lower back pain and spasming in order to perfrom ADL's    Time  6    Period  Weeks    Status  New    Target Date  08/22/19      PT LONG TERM GOAL #2   Title  Patient will sleep through the night without increased lower back pain.    Time  6    Period  Weeks    Status  New    Target Date  08/22/19      PT LONG TERM GOAL #3   Title  Patient will deomnstrate a 55% limitation on FOTO    Time  6    Period  Weeks    Status  New    Target Date  08/22/19            Plan - 08/03/19 1024    Clinical Impression Statement  Pt reports overall improving but has intermittent pain increases. He has difficulty with sleeping still but has changed when he takes his  meds which may help. Hip flexion strength improved. hip abduction strength still weak. Worked on gluteal strengthening.    PT Next Visit Plan  continue pelvic floor strengthening; soft tissue mobilization to lumbar spine. Will likely need to be perfromed in side lying since he has difficulty lying prone. Consider LAD if he can toerate. Side lying spine mobilization if able. patient was iven seated clams shelles. Review abdominal breathing; progress exercises as tolerated. Consider standing exercises. Modlaities PRN; review stretches; add glute stretch when able    PT Home Exercise Plan  tennis ball trigger point release; seated hamstring stretch with minimal range, seated t-band clamshell, verbal cues to recruit pelvic floor with transition and stabilization exercises.       Patient will benefit from skilled therapeutic intervention in order to improve the following deficits and impairments:  Abnormal gait, Decreased mobility, Decreased strength, Postural dysfunction, Pain, Increased muscle spasms, Decreased activity tolerance, Decreased endurance, Increased fascial restricitons, Difficulty walking, Decreased range of motion  Visit Diagnosis: Chronic bilateral low back pain without sciatica  Difficulty in walking, not elsewhere classified  Muscle spasm of back  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 Lumbar foraminal stenosis 03/14/2019  . Congenital spinal stenosis of lumbar region 10/28/2018  . 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 Torn meniscus 09/23/2018  . It band syndrome, right 08/18/2018  . Personal history of colonic polyps 07/28/2018  . Essential hypertension 07/28/2018  . Gastroesophageal reflux disease 07/28/2018  .  Inflammation of right sacroiliac joint ([place]) 05/08/2016  . 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 Pain in left knee 05/15/2015    Marcus Armstrong ,  [place] 08/03/2019, 12:14 PM  [facility] [address] [place], [name], [address] Phone: [phone]   Fax:  [phone]  Name: Marcus Armstrong MRN: [id number] Date of Birth: 1966/02/10

## 2019-08-05 ENCOUNTER — Ambulatory Visit: Payer: Managed Care, Other (non HMO) | Admitting: Physical Therapy

## 2019-08-09 ENCOUNTER — Encounter: Payer: Self-pay | Admitting: Physical Therapy

## 2019-08-09 ENCOUNTER — Other Ambulatory Visit: Payer: Self-pay

## 2019-08-09 ENCOUNTER — Ambulatory Visit: Payer: Managed Care, Other (non HMO) | Attending: Neurosurgery | Admitting: Physical Therapy

## 2019-08-09 DIAGNOSIS — M545 Low back pain: Secondary | ICD-10-CM | POA: Insufficient documentation

## 2019-08-09 DIAGNOSIS — M62830 Muscle spasm of back: Secondary | ICD-10-CM | POA: Insufficient documentation

## 2019-08-09 DIAGNOSIS — R262 Difficulty in walking, not elsewhere classified: Secondary | ICD-10-CM

## 2019-08-09 DIAGNOSIS — M5441 Lumbago with sciatica, right side: Secondary | ICD-10-CM | POA: Diagnosis present

## 2019-08-09 DIAGNOSIS — G8929 Other chronic pain: Secondary | ICD-10-CM | POA: Insufficient documentation

## 2019-08-09 NOTE — Therapy (Signed)
[place], [name], [address] Phone: [phone]   Fax:  [phone]  Physical Therapy Treatment  Patient Details  Name: Marcus Armstrong MRN: [id number] Date of Birth: Aug 29, 1965 Referring Provider (PT): Dr Earnie Larsson     Encounter Date: 08/09/2019  PT End of Session - 08/09/19 0940    Visit Number  6    Number of Visits  12    Date for PT Re-Evaluation  08/22/19    Authorization Type  [employer] 20% after deductable    PT Start Time  0933    PT Stop Time  1014    PT Time Calculation (min)  41 min       Past Medical History:  Diagnosis Date  . GERD (gastroesophageal reflux disease)   . History of kidney stones 2016  . Hypertension   . Neurofibromatosis ([place])   . Sleep apnea     Past Surgical History:  Procedure Laterality Date  . CARDIAC CATHETERIZATION  02/27/2012   [facility]): Normal coronaries aside from mLAD myocardial bridging verus coronary spasm, LV > 65%  . COLON SURGERY     colonoscpy with polyp resection  . polyp removal      There were no vitals filed for this visit.  Subjective Assessment - 08/09/19 0936    Subjective  I had a powerful sneeze Wednesday after I left PT and pulled a muscle from my chest into back and hip. On my feet alot Saturday for shopping for my son and had to sit and rest. Canceled the last appointment.    Currently in Pain?  Yes    Pain Score  5     Pain Location  Back    Pain Orientation  Right;Left    Pain Descriptors / Indicators  Aching    Pain Type  Chronic pain    Aggravating Factors   sneezing, activity in feet    Pain Relieving Factors  meds, stretch, rest                       [facility] Adult PT Treatment/Exercise - 08/09/19 0001      Lumbar Exercises: Stretches   Lower Trunk Rotation Limitations  open books as tolerated    Hip Flexor Stretch  Right;2 reps;30 seconds      Lumbar Exercises: Aerobic   Nustep  L7 UE/LE x 7  minutes       Lumbar Exercises: Standing   Row  20 reps    Theraband Level (Row)  Level 3 (Green)    Shoulder Extension  20 reps    Theraband Level (Shoulder Extension)  Level 3 (Green)    Other Standing Lumbar Exercises  palloff press double green band x 10 , double band green chops - increased pain pulling to right hip      Lumbar Exercises: Seated   Sit to Stand  10 reps    Sit to Stand Limitations  added green band       Lumbar Exercises: Supine   Other Supine Lumbar Exercises  green band clam x 20     Other Supine Lumbar Exercises  hooklying 90/90 with feet in chair - posterior pelvic tilits with left heel lift to engage right obliques x 10       Lumbar Exercises: Sidelying   Clam  20 reps    Clam Limitations  green  PT Short Term Goals - 08/09/19 1148      PT SHORT TERM GOAL #1   Title  Patient will increase lumbar flexion by 20 degrees    Time  3    Period  Weeks    Status  Unable to assess      PT SHORT TERM GOAL #2   Title  Patient will demonstrate 4+/5 right LE strength    Time  3    Period  Weeks    Status  On-going      PT SHORT TERM GOAL #3   Title  Patient will be independent with basic stretching and strengthening program    Time  3    Period  Weeks    Status  Achieved        PT Long Term Goals - 07/11/19 1317      PT LONG TERM GOAL #1   Title  Patient will stand for 45 min without self report of increased lower back pain and spasming in order to perfrom ADL's    Time  6    Period  Weeks    Status  New    Target Date  08/22/19      PT LONG TERM GOAL #2   Title  Patient will sleep through the night without increased lower back pain.    Time  6    Period  Weeks    Status  New    Target Date  08/22/19      PT LONG TERM GOAL #3   Title  Patient will deomnstrate a 55% limitation on FOTO    Time  6    Period  Weeks    Status  New    Target Date  08/22/19            Plan - 08/09/19 0943    Clinical Impression  Statement  Pain exacerbation after sneezing last week and had increased pain with walking at the mall. Continued focus on core stabilization and gluteal strength. He reports less incontinence since using pelvic floor strengthening techniques.    PT Next Visit Plan  continue pelvic floor strengthening; soft tissue mobilization to lumbar spine. Will likely need to be perfromed in side lying since he has difficulty lying prone. Consider LAD if he can toerate. Side lying spine mobilization if able. patient was iven seated clams shelles. Review abdominal breathing; progress exercises as tolerated. Consider standing exercises. Modlaities PRN; review stretches; add glute stretch when able    PT Home Exercise Plan  tennis ball trigger point release; seated hamstring stretch with minimal range, seated t-band clamshell, verbal cues to recruit pelvic floor with transition and stabilization exercises.       Patient will benefit from skilled therapeutic intervention in order to improve the following deficits and impairments:  Abnormal gait, Decreased mobility, Decreased strength, Postural dysfunction, Pain, Increased muscle spasms, Decreased activity tolerance, Decreased endurance, Increased fascial restricitons, Difficulty walking, Decreased range of motion  Visit Diagnosis: Chronic bilateral low back pain without sciatica  Difficulty in walking, not elsewhere classified  Muscle spasm of back  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 Lumbar foraminal stenosis 03/14/2019  . Congenital spinal stenosis of lumbar region 10/28/2018  . 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 Torn meniscus 09/23/2018  . It band syndrome, right 08/18/2018  . Personal history of colonic polyps 07/28/2018  . Essential hypertension 07/28/2018  .  Gastroesophageal reflux disease 07/28/2018  . Inflammation of right sacroiliac joint ([place]) 05/08/2016  .  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 Pain in left knee 05/15/2015    Dorene Ar, [place] 08/09/2019, 11:48 AM  [facility] [address] [place], [name], [address] Phone: [phone]   Fax:  [phone]  Name: Vinny Colee MRN: [id number] Date of Birth: 11-Sep-1965

## 2019-08-12 ENCOUNTER — Encounter: Payer: Self-pay | Admitting: Physical Therapy

## 2019-08-12 ENCOUNTER — Other Ambulatory Visit: Payer: Self-pay

## 2019-08-12 ENCOUNTER — Ambulatory Visit: Payer: Managed Care, Other (non HMO) | Admitting: Physical Therapy

## 2019-08-12 DIAGNOSIS — M62830 Muscle spasm of back: Secondary | ICD-10-CM

## 2019-08-12 DIAGNOSIS — G8929 Other chronic pain: Secondary | ICD-10-CM

## 2019-08-12 DIAGNOSIS — M545 Low back pain: Secondary | ICD-10-CM

## 2019-08-12 DIAGNOSIS — R262 Difficulty in walking, not elsewhere classified: Secondary | ICD-10-CM

## 2019-08-12 DIAGNOSIS — M5441 Lumbago with sciatica, right side: Secondary | ICD-10-CM

## 2019-08-12 NOTE — Therapy (Signed)
[place], [name], [address] Phone: [phone]   Fax:  [phone]  Physical Therapy Treatment/Re-cert  Patient Details  Name: Marcus Armstrong MRN: [id number] Date of Birth: 01/15/1966 Referring Provider (PT): Dr Earnie Larsson     Encounter Date: 08/12/2019  PT End of Session - 08/12/19 0936    Visit Number  7    Number of Visits  19    Date for PT Re-Evaluation  09/23/19    Authorization Type  [employer] 20% after deductable    PT Start Time  0931    PT Stop Time  1011    PT Time Calculation (min)  40 min    Activity Tolerance  Patient tolerated treatment well    Behavior During Therapy  [facility] for tasks assessed/performed       Past Medical History:  Diagnosis Date  . GERD (gastroesophageal reflux disease)   . History of kidney stones 2016  . Hypertension   . Neurofibromatosis ([place])   . Sleep apnea     Past Surgical History:  Procedure Laterality Date  . CARDIAC CATHETERIZATION  02/27/2012   [facility]): Normal coronaries aside from mLAD myocardial bridging verus coronary spasm, LV > 65%  . COLON SURGERY     colonoscpy with polyp resection  . polyp removal      There were no vitals filed for this visit.  Subjective Assessment - 08/12/19 0933    Subjective  Patient went up and down the stairs a few times today and now his back is throbbing. He hasn't ahd his large spikes in pain that he had.    Limitations  Standing;Walking    How long can you stand comfortably?  limited time whie perfroming daily tasks    How long can you walk comfortably?  limited community distances. Can have cramping in the right leg.    Currently in Pain?  Yes    Pain Score  4     Pain Location  Back    Pain Orientation  Right    Pain Descriptors / Indicators  Aching    Pain Type  Chronic pain    Pain Onset  1 to 4 weeks ago    Pain Frequency  Constant    Aggravating Factors   sneezing activity    Effect of Pain on  Daily Activities  difficulty  performing ADL's         [facility] PT Assessment - 08/12/19 0001      Assessment   Medical Diagnosis  Low Back Pain/ lumbar Fusion     Referring Provider (PT)  Dr Earnie Larsson        AROM   Lumbar Flexion  40    Lumbar Extension  5     Lumbar - Left Rotation  limited 75%       Strength   Right Hip Flexion  4/5    Right Hip ABduction  3+/5    Left Hip Flexion  4+/5    Left Hip ABduction  4+/5    Left Hip ADduction  5/5      Palpation   Palpation comment  continues to have spasming in right and left inferior spine and QL                    [facility] Adult PT Treatment/Exercise - 08/12/19 0001      Lumbar Exercises: Stretches   Piriformis Stretch  2 reps;20 seconds  Lumbar Exercises: Aerobic   Nustep  L7 UE/LE x 5 minutes       Lumbar Exercises: Standing   Row  20 reps    Theraband Level (Row)  Level 3 (Green)    Shoulder Extension  20 reps    Theraband Level (Shoulder Extension)  Level 3 (Green)    Other Standing Lumbar Exercises  Standing low level slow marh 2x10; hip abduction in pain free range 2x10       Lumbar Exercises: Supine   Other Supine Lumbar Exercises  green band clam x 20       Manual Therapy   Manual Therapy  Soft tissue mobilization;Joint mobilization    Manual therapy comments  LAD 2x 45 sec hold each leg with g    Soft tissue mobilization  IASTYM to lumbar spine              PT Education - 08/12/19 0935    Education Details  technique with ther-ex    Person(s) Educated  Patient    Methods  Explanation;Demonstration;Tactile cues;Verbal cues    Comprehension  Verbalized understanding;Returned demonstration;Verbal cues required;Tactile cues required       PT Short Term Goals - 08/12/19 1208      PT SHORT TERM GOAL #1   Title  Patient will increase lumbar flexion by 20 degrees    Baseline  40    Time  3    Period  Weeks    Status  Achieved    Target Date  08/01/19      PT SHORT TERM GOAL #2    Title  Patient will demonstrate 4+/5 right LE strength    Baseline  improving but goal not met    Time  3    Period  Weeks    Status  On-going    Target Date  08/01/19      PT SHORT TERM GOAL #3   Title  Patient will be independent with basic stretching and strengthening program    Baseline  perfroming at home    Time  3    Period  Weeks    Status  On-going    Target Date  08/01/19        PT Long Term Goals - 07/11/19 1317      PT LONG TERM GOAL #1   Title  Patient will stand for 45 min without self report of increased lower back pain and spasming in order to perfrom ADL's    Time  6    Period  Weeks    Status  New    Target Date  08/22/19      PT LONG TERM GOAL #2   Title  Patient will sleep through the night without increased lower back pain.    Time  6    Period  Weeks    Status  New    Target Date  08/22/19      PT LONG TERM GOAL #3   Title  Patient will deomnstrate a 55% limitation on FOTO    Time  6    Period  Weeks    Status  New    Target Date  08/22/19            Plan - 08/12/19 1024    Clinical Impression Statement  Patient reported throbbing pain upon arirval. Therapy perfromed manual therapy to reduce pain. The patient had less pain after manual therapy. Therapy reviewed triggewr points and radiating pain. he has some  pain that wraps around to the front of his hip and pain that goes down into his buttock. Both areas correlate to his trigger points in his QL and gluteals. He perfromed standing exercises with minor increase in pain. therapy will continue to progress as tolerated    Personal Factors and Comorbidities  Comorbidity 1;Comorbidity 2;Fitness    Comorbidities  left menical tear; right IT band syndrome ( history ), 20 lb increase in weight since surgery    Examination-Activity Limitations  Bend;Carry;Lift;Locomotion Level;Reach Overhead;Squat    Clinical Decision Making  Moderate    Rehab Potential  Good    PT Frequency  2x / week    PT  Duration  6 weeks    PT Treatment/Interventions  ADLs/Self Care Home Management;Electrical Stimulation;Iontophoresis 37m/ml Dexamethasone;Moist Heat;Traction;Ultrasound;DME Instruction;Stair training;Functional mobility training;Gait training;Therapeutic activities;Therapeutic exercise;Neuromuscular re-education;Patient/family education;Manual techniques;Dry needling;Passive range of motion;Taping    PT Next Visit Plan  continue pelvic floor strengthening; soft tissue mobilization to lumbar spine. Will likely need to be perfromed in side lying since he has difficulty lying prone. Consider LAD if he can toerate. Side lying spine mobilization if able. patient was iven seated clams shelles. Review abdominal breathing; progress exercises as tolerated. Consider standing exercises. Modlaities PRN; review stretches; add glute stretch when able    PT Home Exercise Plan  tennis ball trigger point release; seated hamstring stretch with minimal range, seated t-band clamshell, verbal cues to recruit pelvic floor with transition and stabilization exercises.    Consulted and Agree with Plan of Care  Patient       Patient will benefit from skilled therapeutic intervention in order to improve the following deficits and impairments:  Abnormal gait, Decreased mobility, Decreased strength, Postural dysfunction, Pain, Increased muscle spasms, Decreased activity tolerance, Decreased endurance, Increased fascial restricitons, Difficulty walking, Decreased range of motion  Visit Diagnosis: Difficulty in walking, not elsewhere classified  Chronic bilateral low back pain without sciatica  Muscle spasm of back  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 Lumbar foraminal stenosis 03/14/2019  . Congenital spinal stenosis of lumbar region 10/28/2018  . 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 Torn meniscus 09/23/2018  . It band syndrome, right  08/18/2018  . Personal history of colonic polyps 07/28/2018  . Essential hypertension 07/28/2018  . Gastroesophageal reflux disease 07/28/2018  . Inflammation of right sacroiliac joint ([place] 05/08/2016  . 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 Pain in left knee 05/15/2015    DCarney LivingPT DPT  08/12/2019, 12:09 PM  [facility] [address]Phone: [phone]  Fax:  [phone] Name: Marcus KengMRN: [id number] of Birth: 109-10-1965

## 2019-08-15 ENCOUNTER — Encounter: Payer: Self-pay | Admitting: Physical Therapy

## 2019-08-16 ENCOUNTER — Other Ambulatory Visit: Payer: Self-pay

## 2019-08-16 DIAGNOSIS — I10 Essential (primary) hypertension: Secondary | ICD-10-CM

## 2019-08-16 MED ORDER — LOSARTAN POTASSIUM-HCTZ 100-25 MG PO TABS: 1.0000 | ORAL_TABLET | Freq: Every day | ORAL | 3 refills | Status: DC

## 2019-08-16 NOTE — Telephone Encounter (Signed)
Last OV 06/24/19 Last fill 07/28/18  #90/3

## 2019-08-23 ENCOUNTER — Ambulatory Visit: Payer: Managed Care, Other (non HMO) | Admitting: Physical Therapy

## 2019-08-24 ENCOUNTER — Ambulatory Visit: Payer: Managed Care, Other (non HMO) | Admitting: Dermatology

## 2019-08-25 ENCOUNTER — Ambulatory Visit: Payer: Managed Care, Other (non HMO) | Admitting: Physical Therapy

## 2019-08-29 ENCOUNTER — Other Ambulatory Visit: Payer: Self-pay

## 2019-08-29 ENCOUNTER — Ambulatory Visit: Payer: Managed Care, Other (non HMO) | Attending: Internal Medicine

## 2019-08-29 DIAGNOSIS — Z23 Encounter for immunization: Secondary | ICD-10-CM

## 2019-08-29 DIAGNOSIS — Z20822 Contact with and (suspected) exposure to covid-19: Secondary | ICD-10-CM

## 2019-08-29 NOTE — Progress Notes (Signed)
   Covid-19 Vaccination Clinic  Name:  [name]    MRN: [id number] DOB: January 24, 1966  08/29/2019  [name] was observed post Covid-19 immunization for 30 minutes based on pre-vaccination screening without incident. He was provided with Vaccine Information Sheet and instruction to access the V-Safe system.   [name] was instructed to call 911 with any severe reactions post vaccine: Marland Kitchen Difficulty breathing  . Swelling of face and throat  . A fast heartbeat  . A bad rash all over body  . Dizziness and weakness   Immunizations Administered    Name Date Dose VIS Date Route   Pfizer COVID-19 Vaccine 08/29/2019 11:47 AM 0.3 mL 06/01/2018 Intramuscular   Manufacturer: [place]   Lot: [id number]   [place]: [id number]

## 2019-08-30 ENCOUNTER — Ambulatory Visit: Payer: Managed Care, Other (non HMO) | Admitting: Physical Therapy

## 2019-08-30 DIAGNOSIS — M62830 Muscle spasm of back: Secondary | ICD-10-CM

## 2019-08-30 DIAGNOSIS — R262 Difficulty in walking, not elsewhere classified: Secondary | ICD-10-CM

## 2019-08-30 DIAGNOSIS — G8929 Other chronic pain: Secondary | ICD-10-CM

## 2019-08-30 DIAGNOSIS — M545 Low back pain: Secondary | ICD-10-CM | POA: Diagnosis not present

## 2019-08-30 LAB — NOVEL CORONAVIRUS, NAA: SARS-CoV-2, NAA: NOT DETECTED

## 2019-08-30 LAB — SARS-COV-2, NAA 2 DAY TAT

## 2019-08-31 ENCOUNTER — Encounter: Payer: Self-pay | Admitting: Physical Therapy

## 2019-08-31 NOTE — Therapy (Signed)
[place], [name], [address] Phone: [phone]   Fax:  [phone]  Physical Therapy Treatment  Patient Details  Name: Marcus Armstrong MRN: [id number] Date of Birth: 1965/09/22 Referring Provider (PT): Dr Earnie Larsson     Encounter Date: 08/30/2019  PT End of Session - 08/30/19 1103    Visit Number  8    Number of Visits  19    Date for PT Re-Evaluation  09/23/19    Authorization Type  [employer] 20% after deductable    PT Start Time  1101    PT Stop Time  1142    PT Time Calculation (min)  41 min       Past Medical History:  Diagnosis Date  . GERD (gastroesophageal reflux disease)   . History of kidney stones 2016  . Hypertension   . Neurofibromatosis ([place])   . Sleep apnea     Past Surgical History:  Procedure Laterality Date  . CARDIAC CATHETERIZATION  02/27/2012   [facility]): Normal coronaries aside from mLAD myocardial bridging verus coronary spasm, LV > 65%  . COLON SURGERY     colonoscpy with polyp resection  . polyp removal      There were no vitals filed for this visit.  Subjective Assessment - 08/30/19 1105    Subjective  Patient reports his back is just a little stiff. He has not had a lot of pain in his back. He is having signifcant right knee pain. He put pensiad on it this morning but it ius still hurting him.    Limitations  Standing;Walking    How long can you stand comfortably?  limited time whie perfroming daily tasks    How long can you walk comfortably?  limited community distances. Can have cramping in the right leg.    Currently in Pain?  Yes    Pain Score  6     Pain Location  Knee    Pain Orientation  Right    Pain Descriptors / Indicators  Aching    Pain Type  Chronic pain    Pain Onset  1 to 4 weeks ago    Pain Frequency  Constant    Aggravating Factors   sneezing activty    Pain Relieving Factors  meds, stretch, rest    Effect of Pain on Daily Activities   difficulty perfroming ADL's    Multiple Pain Sites  No                        [facility] Adult PT Treatment/Exercise - 08/31/19 0001      Lumbar Exercises: Stretches   Lower Trunk Rotation Limitations  x20     Piriformis Stretch  2 reps;20 seconds      Lumbar Exercises: Aerobic   Nustep  5 min L5       Lumbar Exercises: Standing   Row  20 reps    Theraband Level (Row)  Level 3 (Green)    Shoulder Extension  20 reps    Theraband Level (Shoulder Extension)  Level 3 (Green)    Other Standing Lumbar Exercises  standing slow march x15 bil; standing hip andcution 2x10 in low range; standing heel raise x20       Lumbar Exercises: Supine   Pelvic Tilt Limitations  x15 with breathing     Bridge Limitations  small rom working on stabilzing core/pelvic floor with bed mobility     Other Supine  Lumbar Exercises  green band clam x 20     Other Supine Lumbar Exercises  attmeted straight leg raise but patient had pain       Manual Therapy   Manual Therapy  Soft tissue mobilization;Joint mobilization    Manual therapy comments  LAD 2x 45 sec hold each leg with g    Joint Mobilization  PA mobilization to lower lumbar spine from L3 -L5     Soft tissue mobilization  IASTYM to lumbar spine              PT Education - 08/31/19 1240    Education Details  reviewed HEp and symtpom management    Person(s) Educated  Patient    Methods  Explanation;Demonstration;Tactile cues;Verbal cues    Comprehension  Verbalized understanding;Returned demonstration;Verbal cues required;Tactile cues required       PT Short Term Goals - 08/12/19 1208      PT SHORT TERM GOAL #1   Title  Patient will increase lumbar flexion by 20 degrees    Baseline  40    Time  3    Period  Weeks    Status  Achieved    Target Date  08/01/19      PT SHORT TERM GOAL #2   Title  Patient will demonstrate 4+/5 right LE strength    Baseline  improving but goal not met    Time  3    Period  Weeks    Status   On-going    Target Date  08/01/19      PT SHORT TERM GOAL #3   Title  Patient will be independent with basic stretching and strengthening program    Baseline  perfroming at home    Time  3    Period  Weeks    Status  On-going    Target Date  08/01/19        PT Long Term Goals - 07/11/19 1317      PT LONG TERM GOAL #1   Title  Patient will stand for 45 min without self report of increased lower back pain and spasming in order to perfrom ADL's    Time  6    Period  Weeks    Status  New    Target Date  08/22/19      PT LONG TERM GOAL #2   Title  Patient will sleep through the night without increased lower back pain.    Time  6    Period  Weeks    Status  New    Target Date  08/22/19      PT LONG TERM GOAL #3   Title  Patient will deomnstrate a 55% limitation on FOTO    Time  6    Period  Weeks    Status  New    Target Date  08/22/19            Plan - 08/30/19 1135    Clinical Impression Statement  Therapy attmepted to review exercises that are good for his knee and hip. He had pain in his groin area with SLR so exercise washalted ad well as bridges. He is going to be limited by the pain in his anterior hip and groin. He was given standing exercises for his knee and back. He had no significant increase in pain. At this point he is likley reaching his max benefit fro his back. His knee is limiting him biut he just having minor soreness in  his back    Personal Factors and Comorbidities  Comorbidity 1;Comorbidity 2;Fitness    Comorbidities  left menical tear; right IT band syndrome ( history ), 20 lb increase in weight since surgery    Examination-Activity Limitations  Bend;Carry;Lift;Locomotion Level;Reach Overhead;Squat    Examination-Participation Restrictions  Cleaning;Community Activity;Yard Work    [employer]  Evolving/Moderate complexity    Clinical Decision Making  Moderate    Rehab Potential  Good    PT Frequency  2x / week    PT  Duration  6 weeks    PT Treatment/Interventions  ADLs/Self Care Home Management;Electrical Stimulation;Iontophoresis 57m/ml Dexamethasone;Moist Heat;Traction;Ultrasound;DME Instruction;Stair training;Functional mobility training;Gait training;Therapeutic activities;Therapeutic exercise;Neuromuscular re-education;Patient/family education;Manual techniques;Dry needling;Passive range of motion;Taping    PT Next Visit Plan  continue pelvic floor strengthening; soft tissue mobilization to lumbar spine. Will likely need to be perfromed in side lying since he has difficulty lying prone. Consider LAD if he can toerate. Side lying spine mobilization if able. patient was iven seated clams shelles. Review abdominal breathing; progress exercises as tolerated. Consider standing exercises. Modlaities PRN; review stretches; add glute stretch when able    PT Home Exercise Plan  tennis ball trigger point release; seated hamstring stretch with minimal range, seated t-band clamshell, verbal cues to recruit pelvic floor with transition and stabilization exercises.       Patient will benefit from skilled therapeutic intervention in order to improve the following deficits and impairments:  Abnormal gait, Decreased mobility, Decreased strength, Postural dysfunction, Pain, Increased muscle spasms, Decreased activity tolerance, Decreased endurance, Increased fascial restricitons, Difficulty walking, Decreased range of motion  Visit Diagnosis: Difficulty in walking, not elsewhere classified  Chronic bilateral low back pain without sciatica  Muscle spasm of back  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 Lumbar foraminal stenosis 03/14/2019  . Congenital spinal stenosis of lumbar region 10/28/2018  . 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 Torn meniscus 09/23/2018  . It band syndrome, right 08/18/2018  . Personal history of colonic polyps  07/28/2018  . Essential hypertension 07/28/2018  . Gastroesophageal reflux disease 07/28/2018  . Inflammation of right [place]roiliac joint ([place] 05/08/2016  . Benign neoplasm of cauda equina ([place] 05/08/2016  . Pain in left knee 05/15/2015    DCarney Living PT DPT  08/31/2019, 12:56 PM  [facility] [place] [address]Phone: [phone]  Fax:  [phone] Name: KSydney AzureMRN: [id number] of Birth: [birth year]

## 2019-09-01 ENCOUNTER — Encounter: Payer: Self-pay | Admitting: Family Medicine

## 2019-09-01 ENCOUNTER — Encounter: Payer: Self-pay | Admitting: Physical Therapy

## 2019-09-01 ENCOUNTER — Ambulatory Visit: Payer: Managed Care, Other (non HMO) | Admitting: Physical Therapy

## 2019-09-01 ENCOUNTER — Other Ambulatory Visit: Payer: Self-pay

## 2019-09-01 DIAGNOSIS — M5441 Lumbago with sciatica, right side: Secondary | ICD-10-CM

## 2019-09-01 DIAGNOSIS — M62830 Muscle spasm of back: Secondary | ICD-10-CM

## 2019-09-01 DIAGNOSIS — R262 Difficulty in walking, not elsewhere classified: Secondary | ICD-10-CM

## 2019-09-01 DIAGNOSIS — G8929 Other chronic pain: Secondary | ICD-10-CM

## 2019-09-01 DIAGNOSIS — I10 Essential (primary) hypertension: Secondary | ICD-10-CM

## 2019-09-01 DIAGNOSIS — M545 Low back pain: Secondary | ICD-10-CM | POA: Diagnosis not present

## 2019-09-01 MED ORDER — DIAZEPAM 5 MG PO TABS: 5.0000 mg | ORAL_TABLET | Freq: Four times a day (QID) | ORAL | 0 refills | Status: DC | PRN

## 2019-09-01 MED ORDER — AMLODIPINE BESYLATE 10 MG PO TABS: 10.0000 mg | ORAL_TABLET | Freq: Every day | ORAL | 0 refills | Status: DC

## 2019-09-01 NOTE — Telephone Encounter (Signed)
[facility] OV 06/24/2019 Last fill for Diazepam 03/16/19  #30/0 Last fill for Amlodipine 07/28/18  #90/3 Please advise

## 2019-09-02 NOTE — Therapy (Signed)
[place], [name], [address] Phone: [phone]   Fax:  [phone]  Physical Therapy Treatment  Patient Details  Name: Marcus Armstrong MRN: [id number] Date of Birth: 07/13/1965 Referring Provider (PT): Dr Earnie Larsson     Encounter Date: 09/01/2019  PT End of Session - 09/01/19 0940    Visit Number  9    Number of Visits  19    Date for PT Re-Evaluation  09/23/19    Authorization Type  [employer] 20% after deductable    PT Start Time  0933    PT Stop Time  1013    PT Time Calculation (min)  40 min    Activity Tolerance  Patient tolerated treatment well    Behavior During Therapy  [facility] for tasks assessed/performed       Past Medical History:  Diagnosis Date  . GERD (gastroesophageal reflux disease)   . History of kidney stones 2016  . Hypertension   . Neurofibromatosis ([place])   . Sleep apnea     Past Surgical History:  Procedure Laterality Date  . CARDIAC CATHETERIZATION  02/27/2012   [facility]): Normal coronaries aside from mLAD myocardial bridging verus coronary spasm, LV > 65%  . COLON SURGERY     colonoscpy with polyp resection  . polyp removal      There were no vitals filed for this visit.  Subjective Assessment - 09/01/19 0937    Subjective  Patient is having a little pinching in his back today. He has some stiffness. His knee is feeling much better,    Limitations  Standing;Walking    How long can you stand comfortably?  limited time whie perfroming daily tasks    How long can you walk comfortably?  limited community distances. Can have cramping in the right leg.    Currently in Pain?  Yes    Pain Score  3     Pain Location  Back    Pain Orientation  Right    Pain Descriptors / Indicators  Aching    Pain Type  Chronic pain    Pain Radiating Towards  radiating into testicles today    Pain Onset  1 to 4 weeks ago    Pain Frequency  Constant    Aggravating Factors   just stiff  when he gto out of bed    Pain Relieving Factors  meds, rest,    Effect of Pain on Daily Activities  difficulty perfroming ADL's                        [facility] Adult PT Treatment/Exercise - 09/02/19 0001      Lumbar Exercises: Stretches   Lower Trunk Rotation Limitations  x20     Piriformis Stretch  2 reps;20 seconds      Lumbar Exercises: Aerobic   Nustep  5 min L5       Lumbar Exercises: Standing   Row  20 reps    Theraband Level (Row)  Level 4 (Blue)    Shoulder Extension  20 reps    Theraband Level (Shoulder Extension)  Level 4 (Blue)      Lumbar Exercises: Supine   AB Set Limitations  reviewed abdominal breathing     Pelvic Tilt Limitations  x15 with breathing     Bridge Limitations  small rom working on stabilzing core/pelvic floor with bed mobility     Other Supine Lumbar Exercises  green band  clam x 20     Other Supine Lumbar Exercises  supine march x20       Manual Therapy   Manual Therapy  Soft tissue mobilization;Joint mobilization    Manual therapy comments  LAD 2x 45 sec hold each leg with g    Joint Mobilization  PA mobilization to lower lumbar spine from L3 -L5     Soft tissue mobilization  IASTYM to lumbar spine              PT Education - 09/01/19 0939    Education Details  reviewed stretches for lower back and hip    Person(s) Educated  Patient    Methods  Explanation;Demonstration;Tactile cues;Verbal cues    Comprehension  Verbalized understanding;Returned demonstration;Verbal cues required;Tactile cues required       PT Short Term Goals - 09/02/19 1216      PT SHORT TERM GOAL #1   Title  Patient will increase lumbar flexion by 20 degrees    Baseline  40    Time  3    Period  Weeks    Status  Achieved    Target Date  08/01/19      PT SHORT TERM GOAL #2   Title  Patient will demonstrate 4+/5 right LE strength    Time  3    Period  Weeks    Status  On-going    Target Date  08/01/19      PT SHORT TERM GOAL #3   Title   Patient will be independent with basic stretching and strengthening program    Baseline  perfroming at home    Time  3    Period  Weeks    Status  On-going    Target Date  08/01/19        PT Long Term Goals - 07/11/19 1317      PT LONG TERM GOAL #1   Title  Patient will stand for 45 min without self report of increased lower back pain and spasming in order to perfrom ADL's    Time  6    Period  Weeks    Status  New    Target Date  08/22/19      PT LONG TERM GOAL #2   Title  Patient will sleep through the night without increased lower back pain.    Time  6    Period  Weeks    Status  New    Target Date  08/22/19      PT LONG TERM GOAL #3   Title  Patient will deomnstrate a 55% limitation on FOTO    Time  6    Period  Weeks    Status  New    Target Date  08/22/19            Plan - 09/01/19 1003    Clinical Impression Statement  Patient had some tightness in his right gluteal today but it improved with manual therapy. he expressed intrest in trigger point dry needling. He is 6 months post-op and it is in the plan of care to try needling. His FOTO cscore has improved significantly. He is still having pain and stiff neess in the morning. he will schedule out 1x a week for the next 3 weeks until his plan of care is up then we will re-assess.    Personal Factors and Comorbidities  Comorbidity 1;Comorbidity 2;Fitness    Comorbidities  left menical tear; right IT band syndrome ( history ),  20 lb increase in weight since surgery    Examination-Activity Limitations  Bend;Carry;Lift;Locomotion Level;Reach Overhead;Squat    Examination-Participation Restrictions  Cleaning;Community Activity;Yard Work    [employer]chant [employer] officer  Evolving/Moderate complexity    Clinical Decision Making  Moderate    Rehab Potential  Good    PT Frequency  2x / week    PT Duration  6 weeks    PT Treatment/Interventions  ADLs/Self Care Home Management;Electrical Stimulation;Iontophoresis  4mg /ml Dexamethasone;Moist Heat;Traction;Ultrasound;DME Instruction;Stair training;Functional mobility training;Gait training;Therapeutic activities;Therapeutic exercise;Neuromuscular re-education;Patient/family education;Manual techniques;Dry needling;Passive range of motion;Taping    PT Next Visit Plan  continue pelvic floor strengthening; soft tissue mobilization to lumbar spine. Will likely need to be perfromed in side lying since he has difficulty lying prone. Consider LAD if he can toerate. Side lying spine mobilization if able. patient was iven seated clams shelles. Review abdominal breathing; progress exercises as tolerated. Consider standing exercises. Modlaities PRN; review stretches; add glute stretch when able    PT Home Exercise Plan  tennis ball trigger point release; seated hamstring stretch with minimal range, seated t-band clamshell, verbal cues to recruit pelvic floor with transition and stabilization exercises.    Consulted and Agree with Plan of Care  Patient       Patient will benefit from skilled therapeutic intervention in order to improve the following deficits and impairments:  Abnormal gait, Decreased mobility, Decreased strength, Postural dysfunction, Pain, Increased muscle spasms, Decreased activity tolerance, Decreased endurance, Increased fascial restricitons, Difficulty walking, Decreased range of motion  Visit Diagnosis: Difficulty in walking, not elsewhere classified  Chronic bilateral low back pain without sciatica  Muscle spasm of back  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 Lumbar foraminal stenosis 03/14/2019  . Congenital spinal stenosis of lumbar region 10/28/2018  . 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 Torn meniscus 09/23/2018  . It band syndrome, right 08/18/2018  . Personal history of colonic polyps 07/28/2018  . Essential hypertension 07/28/2018  . Gastroesophageal  reflux disease 07/28/2018  . Inflammation of right sacroiliac joint ([place]) 05/08/2016  . 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 Pain in left knee 05/15/2015    Carney Living PT DPT  09/02/2019, 12:19 PM  [facility] [address] [place], [name], [address] Phone: [phone]   Fax:  [phone]  Name: Marcus Armstrong MRN: [id number] Date of Birth: 1965-11-13

## 2019-09-14 ENCOUNTER — Ambulatory Visit: Payer: Managed Care, Other (non HMO) | Attending: Neurosurgery | Admitting: Physical Therapy

## 2019-09-14 ENCOUNTER — Other Ambulatory Visit: Payer: Self-pay

## 2019-09-14 DIAGNOSIS — R262 Difficulty in walking, not elsewhere classified: Secondary | ICD-10-CM | POA: Insufficient documentation

## 2019-09-14 DIAGNOSIS — M545 Low back pain, unspecified: Secondary | ICD-10-CM

## 2019-09-14 DIAGNOSIS — M5441 Lumbago with sciatica, right side: Secondary | ICD-10-CM | POA: Diagnosis present

## 2019-09-14 DIAGNOSIS — G8929 Other chronic pain: Secondary | ICD-10-CM | POA: Diagnosis present

## 2019-09-14 DIAGNOSIS — M62830 Muscle spasm of back: Secondary | ICD-10-CM

## 2019-09-14 NOTE — Therapy (Signed)
[place], [name], [address] Phone: [phone]   Fax:  [phone]  Physical Therapy Treatment  Patient Details  Name: Marcus Armstrong MRN: [id number] Date of Birth: 04-27-1965 Referring Provider (PT): Dr Earnie Larsson     Encounter Date: 09/14/2019  PT End of Session - 09/14/19 1112    Visit Number  10    Number of Visits  19    Date for PT Re-Evaluation  09/23/19    Authorization Type  [employer] 20% after deductable    PT Start Time  1105    PT Stop Time  1145    PT Time Calculation (min)  40 min       Past Medical History:  Diagnosis Date  . GERD (gastroesophageal reflux disease)   . History of kidney stones 2016  . Hypertension   . Neurofibromatosis ([place])   . Sleep apnea     Past Surgical History:  Procedure Laterality Date  . CARDIAC CATHETERIZATION  02/27/2012   [facility]): Normal coronaries aside from mLAD myocardial bridging verus coronary spasm, LV > 65%  . COLON SURGERY     colonoscpy with polyp resection  . polyp removal      There were no vitals filed for this visit.  Subjective Assessment - 09/14/19 1109    Subjective  I lost a best friend to a motorcycle accident last week. This is the first day out of my house. I havent done anything. I am more achey and stiff with the inactivity. I am managing okay with the NSAIDS with the stiffness but I still have achiness in posterior hips/thighs. Less feeling of shifting with rolling over in bed.    Currently in Pain?  Yes    Pain Score  4     Pain Location  Hip    Pain Orientation  Right;Left;Posterior    Pain Descriptors / Indicators  Aching;Spasm    Pain Type  Chronic pain    Aggravating Factors   inactivity    Pain Relieving Factors  NSAIDS                        [facility] Adult PT Treatment/Exercise - 09/14/19 0001      Lumbar Exercises: Stretches   Lower Trunk Rotation Limitations  x20     Piriformis Stretch  2  reps;20 seconds    Other Lumbar Stretch Exercise  doorway lunge  x 10 each-difficult     Other Lumbar Stretch Exercise  standing hip flexor lunge       Lumbar Exercises: Aerobic   Recumbent Bike  Rec bike L2 x 5 minutes       Lumbar Exercises: Standing   Row  20 reps    Theraband Level (Row)  Level 4 (Blue)    Shoulder Extension  20 reps    Theraband Level (Shoulder Extension)  Level 4 (Blue)    Other Standing Lumbar Exercises  blue band pallof press       Lumbar Exercises: Supine   Single Leg Bridge  10 reps    Other Supine Lumbar Exercises  green band clam x 20 , right side only     Other Supine Lumbar Exercises  supine march x20                PT Short Term Goals - 09/02/19 1216      PT SHORT TERM GOAL #1   Title  Patient will increase  lumbar flexion by 20 degrees    Baseline  40    Time  3    Period  Weeks    Status  Achieved    Target Date  08/01/19      PT SHORT TERM GOAL #2   Title  Patient will demonstrate 4+/5 right LE strength    Time  3    Period  Weeks    Status  On-going    Target Date  08/01/19      PT SHORT TERM GOAL #3   Title  Patient will be independent with basic stretching and strengthening program    Baseline  perfroming at home    Time  3    Period  Weeks    Status  On-going    Target Date  08/01/19        PT Long Term Goals - 07/11/19 1317      PT LONG TERM GOAL #1   Title  Patient will stand for 45 min without self report of increased lower back pain and spasming in order to perfrom ADL's    Time  6    Period  Weeks    Status  New    Target Date  08/22/19      PT LONG TERM GOAL #2   Title  Patient will sleep through the night without increased lower back pain.    Time  6    Period  Weeks    Status  New    Target Date  08/22/19      PT LONG TERM GOAL #3   Title  Patient will deomnstrate a 55% limitation on FOTO    Time  6    Period  Weeks    Status  New    Target Date  08/22/19            Plan - 09/14/19  1210    Clinical Impression Statement  Pt reports depression with the loss of his best friend so he has not been active with HEP. Reviewed exercises and added lunges. He reports his stride is longer and he feels more upright with gait.    PT Next Visit Plan  continue pelvic floor strengthening; soft tissue mobilization to lumbar spine. Will likely need to be perfromed in side lying since he has difficulty lying prone. Consider LAD if he can toerate. Side lying spine mobilization if able. patient was iven seated clams shelles. Review abdominal breathing; progress exercises as tolerated. Consider standing exercises. Modlaities PRN; review stretches; add glute stretch when able    PT Home Exercise Plan  tennis ball trigger point release; seated hamstring stretch with minimal range, seated t-band clamshell, verbal cues to recruit pelvic floor with transition and stabilization exercises.    Consulted and Agree with Plan of Care  Patient       Patient will benefit from skilled therapeutic intervention in order to improve the following deficits and impairments:  Abnormal gait, Decreased mobility, Decreased strength, Postural dysfunction, Pain, Increased muscle spasms, Decreased activity tolerance, Decreased endurance, Increased fascial restricitons, Difficulty walking, Decreased range of motion  Visit Diagnosis: Difficulty in walking, not elsewhere classified  Chronic bilateral low back pain without sciatica  Muscle spasm of back  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 Lumbar foraminal stenosis 03/14/2019  . Congenital spinal stenosis of lumbar region 10/28/2018  . 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  Torn meniscus 09/23/2018  . It band syndrome, right 08/18/2018  . Personal history of colonic polyps 07/28/2018  . Essential hypertension 07/28/2018  . Gastroesophageal reflux disease 07/28/2018  . Inflammation of  right sacroiliac joint ([place]) 05/08/2016  . 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 Pain in left knee 05/15/2015    Dorene Ar, PTA 09/14/2019, 12:13 PM  [facility] [address] [place], [name], [address] Phone: [phone]   Fax:  [phone]  Name: Rajat Staver MRN: [id number] Date of Birth: 1966/03/16

## 2019-09-18 ENCOUNTER — Encounter: Payer: Self-pay | Admitting: Family Medicine

## 2019-09-19 ENCOUNTER — Ambulatory Visit: Payer: Managed Care, Other (non HMO) | Attending: Internal Medicine

## 2019-09-19 DIAGNOSIS — Z23 Encounter for immunization: Secondary | ICD-10-CM

## 2019-09-19 NOTE — Progress Notes (Signed)
   Covid-19 Vaccination Clinic  Name:  Marcus Armstrong    MRN: [id number] DOB: 13-May-1965  09/19/2019  Marcus Armstrong was observed post Covid-19 immunization for 30 minutes based on pre-vaccination screening without incident. He was provided with Vaccine Information Sheet and instruction to access the V-Safe system.   Marcus Armstrong was instructed to call 911 with any severe reactions post vaccine: Marland Kitchen Difficulty breathing  . Swelling of face and throat  . A fast heartbeat  . A bad rash all over body  . Dizziness and weakness   Immunizations Administered    Name Date Dose VIS Date Route   Pfizer COVID-19 Vaccine 09/19/2019  5:00 AM 0.3 mL 06/01/2018 Intramuscular   Manufacturer: [place]   Lot: [id number]   [place]: [address]-5

## 2019-09-20 ENCOUNTER — Ambulatory Visit: Payer: Managed Care, Other (non HMO) | Admitting: Physical Therapy

## 2019-09-20 ENCOUNTER — Telehealth: Payer: Self-pay | Admitting: Family Medicine

## 2019-09-20 ENCOUNTER — Other Ambulatory Visit: Payer: Self-pay

## 2019-09-20 NOTE — Telephone Encounter (Signed)
FYI again.

## 2019-09-20 NOTE — Telephone Encounter (Signed)
FYI

## 2019-09-20 NOTE — Telephone Encounter (Signed)
Pt called and said that he thinks he is having an allergic reaction to the covid shot he had yesterday, He said that he is having tingling below base of neck and on top of hands. I talked with Edd Arbour about this and she said that she would recommend for him to be seen sooner rather than later so she said she said he should go to an urgent care, I let pt know and he said he is going to and asked where a [facility] one was located, I let him know we do have the urgent care in [facility] on [address]. He said he will go today and then call us back about the appt he already had scheduled for tomorrow to let us know if he still wants to keep it.

## 2019-09-20 NOTE — Telephone Encounter (Signed)
Patient is called back and stated that he went to a couple of urgent cares and they were packed and would rather wait and see Dr. Bryan Lemma tomorrow. Patient had originally had a mychart visit scheduled but decided he wanted to come in the office.

## 2019-09-20 NOTE — Telephone Encounter (Signed)
tyus

## 2019-09-21 ENCOUNTER — Ambulatory Visit (INDEPENDENT_AMBULATORY_CARE_PROVIDER_SITE_OTHER): Payer: Managed Care, Other (non HMO) | Admitting: Family Medicine

## 2019-09-21 ENCOUNTER — Encounter: Payer: Self-pay | Admitting: Family Medicine

## 2019-09-21 VITALS — BP 124/78 | HR 92 | Temp 97.3°F | Ht 72.0 in | Wt 267.6 lb

## 2019-09-21 DIAGNOSIS — T50Z95A Adverse effect of other vaccines and biological substances, initial encounter: Secondary | ICD-10-CM

## 2019-09-21 DIAGNOSIS — N529 Male erectile dysfunction, unspecified: Secondary | ICD-10-CM

## 2019-09-21 LAB — TESTOSTERONE: Testosterone: 308.28 ng/dL (ref 300.00–890.00)

## 2019-09-21 MED ORDER — SILDENAFIL CITRATE 50 MG PO TABS: 50.0000 mg | ORAL_TABLET | Freq: Every day | ORAL | 1 refills | Status: DC | PRN

## 2019-09-21 NOTE — Progress Notes (Signed)
Marcus Armstrong is a 54 y.o. male  Chief Complaint  Patient presents with  . Acute Visit    C/O numbness in neck after receiving COVID vaccine, erectile dysfunction, discoloration at left deltoid after vaccine also     HPI: Marcus Armstrong is a 54 y.o. male   1. Pt had second covid vaccine 2 days ago on 09/19/19 (1st vaccine done 08/29/19) and notes bruising/rash at the injection site (Lt deltoid) and a tingling in his posterior neck just to the right of his spine neck that began while in observation area after immunization. No fever, chills, neck pain. When he tilts his head to the left, the tingling increases. He felt symptoms were worse day #2 compared to day #1 and today is the same as yesterday.   2. Erectile dysfunction - trouble achieving and maintaining an erection. New issue. He has never had his testosterone level checked. Has not been on meds.    Past Medical History:  Diagnosis Date  . GERD (gastroesophageal reflux disease)   . History of kidney stones 2016  . Hypertension   . Neurofibromatosis ([employer] [facility] [place])   . Sleep apnea     Past Surgical History:  Procedure Laterality Date  . CARDIAC CATHETERIZATION  02/27/2012   [facility]): Normal coronaries aside from mLAD myocardial bridging verus coronary spasm, LV > 65%  . COLON SURGERY     colonoscpy with polyp resection  . polyp removal      Social History   Socioeconomic History  . Marital status: Divorced    Spouse name: Not on file  . Number of children: Not on file  . Years of education: Not on file  . Highest education level: Not on file  Occupational History  . Not on file  Tobacco Use  . Smoking status: Former Smoker    Packs/day: 0.25    Years: 30.00    Pack years: 7.50    Types: Cigarettes    Quit date: 06/06/2018    Years since quitting: 1.2  . Smokeless tobacco: Never Used  Vaping Use  . Vaping Use: Never used  Substance and Sexual Activity  . Alcohol use: Yes    Comment: occasional beer  . 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 Sexual activity: Not on file  Other Topics Concern  . Not on file  Social History Narrative   Right handed    Social Determinants of Health   Financial Resource Strain:   . Difficulty of Paying Living Expenses:   Food Insecurity:   . Worried About Charity fundraiser in the Last Year:   . Arboriculturist in the Last Year:   Transportation Needs:   . Film/video editor (Medical):   Marland Kitchen Lack of Transportation (Non-Medical):   Physical Activity:   . Days of Exercise per Week:   . Minutes of Exercise per Session:   Stress:   . Feeling of Stress :   Social Connections:   . Frequency of Communication with Friends and Family:   . Frequency of Social Gatherings with Friends and Family:   . Attends Religious Services:   . Active Member of Clubs or Organizations:   . Attends Archivist Meetings:   Marland Kitchen Marital Status:   Intimate Partner Violence:   . Fear of Current or Ex-Partner:   . Emotionally Abused:   Marland Kitchen Physically Abused:   .  Sexually Abused:     Family History  Problem Relation Age of Onset  . Cancer Father 21       prostate     Immunization History  Administered Date(s) Administered  . PFIZER SARS-COV-2 Vaccination 08/29/2019, 09/19/2019  . Tdap 06/01/2019    Outpatient Encounter Medications as of 09/21/2019  Medication Sig  . amLODipine (NORVASC) 10 MG tablet Take 1 tablet (10 mg total) by mouth daily.  . Cannabidiol POWD Apply 1 application topically 3 (three) times daily as needed (pain.). CBD Pain Relief Roll-O  . cetirizine (ZYRTEC) 10 MG tablet Take 10 mg by mouth every other day.   . diazepam (VALIUM) 5 MG tablet Take 1-2 tablets (5-10 mg total) by mouth every 6 (six) hours as needed for muscle spasms.  . Diclofenac Sodium (PENNSAID) 2 % SOLN Place 1 application onto the skin 2 (two) times daily. (Patient taking differently: Place 1  application onto the skin 2 (two) times daily as needed (pain.). )  . fluticasone (FLONASE) 50 MCG/ACT nasal spray Place 1-2 sprays into both nostrils daily as needed (allergies.).   Marland Kitchen gabapentin (NEURONTIN) 300 MG capsule TAKE ONE CAPSULE BY MOUTH THREE TIMES DAILY  . ibuprofen (ADVIL) 800 MG tablet Take 1 tablet (800 mg total) by mouth every 8 (eight) hours as needed. (Patient taking differently: Take 800 mg by mouth every 8 (eight) hours as needed (pain). )  . ketoconazole (NIZORAL) 2 % shampoo Apply 1 application topically 2 (two) times a week.  . losartan-hydrochlorothiazide (HYZAAR) 100-25 MG tablet Take 1 tablet by mouth daily.  Marland Kitchen omeprazole (PRILOSEC) 20 MG capsule Take 1 capsule (20 mg total) by mouth 2 (two) times daily before a meal. (Patient taking differently: Take 20 mg by mouth 2 (two) times daily as needed (acid reflux/indigestion.). )  . cyclobenzaprine (FLEXERIL) 10 MG tablet TAKE 1 TABLET(10 MG) BY MOUTH THREE TIMES DAILY AS NEEDED FOR MUSCLE SPASMS (Patient not taking: Reported on 09/21/2019)  . naproxen sodium (ALEVE) 220 MG tablet Take 440 mg by mouth 3 (three) times daily as needed (pain.). (Patient not taking: Reported on 09/21/2019)  . pregabalin (LYRICA) 75 MG capsule Take 75 mg by mouth 2 (two) times daily. (Patient not taking: Reported on 09/21/2019)  . Scar Treatment Products [facility]) GEL Apply to affected area BID (Patient not taking: Reported on 09/21/2019)  . [DISCONTINUED] hydrOXYzine (VISTARIL) 50 MG capsule Take 1 capsule (50 mg total) by mouth at bedtime as needed. (Patient not taking: Reported on 07/11/2019)   No facility-administered encounter medications on file as of 09/21/2019.     ROS: Pertinent positives and negatives noted in HPI. Remainder of ROS non-contributory    Allergies  Allergen Reactions  . Cat Hair Extract Hives, Shortness Of Breath, Swelling, Rash and Other (See Comments)  . Shellfish Allergy Shortness Of Breath, Itching and Swelling    Eye  ball swelling (when not eating fresh seafood, breathing problems)    BP 124/78   Pulse 92   Temp (!) 97.3 F (36.3 C) (Tympanic)   Ht 6' (1.829 m)   Wt 267 lb 9.6 oz (121.4 kg)   SpO2 93%   BMI 36.29 kg/m    Wt Readings from Last 3 Encounters:  09/21/19 267 lb 9.6 oz (121.4 kg)  06/28/19 271 lb (122.9 kg)  06/24/19 271 lb 3.2 oz (123 kg)     Physical Exam Constitutional:      General: He is not in acute distress.    Appearance: Normal appearance. He  is not toxic-appearing.  Musculoskeletal:        General: Normal range of motion.     Cervical back: Normal range of motion and neck supple. No rigidity. No spinous process tenderness or muscular tenderness.  Lymphadenopathy:     Cervical: No cervical adenopathy.  Skin:    General: Skin is warm and dry.     Findings: Bruising (Lt deltoid with 2 small areas of hyperpigmentation) present.  Neurological:     General: No focal deficit present.     Mental Status: He is alert and oriented to person, place, and time.     Sensory: No sensory deficit.     Motor: No weakness.     Gait: Gait normal.  Psychiatric:        Mood and Affect: Mood normal.        Behavior: Behavior normal.      A/P:  1. Erectile dysfunction, unspecified erectile dysfunction type - Testosterone Rx: - sildenafil (VIAGRA) 50 MG tablet; Take 1 tablet (50 mg total) by mouth daily as needed for erectile dysfunction.  Dispense: 10 tablet; Refill: 1  2. Immunization reaction, initial encounter - continue to monitor, no intervention at this time   This visit occurred during the SARS-CoV-2 public health emergency.  Safety protocols were in place, including screening questions prior to the visit, additional usage of staff PPE, and extensive cleaning of exam room while observing appropriate contact time as indicated for disinfecting solutions.

## 2019-09-22 ENCOUNTER — Encounter: Payer: Self-pay | Admitting: Family Medicine

## 2019-09-23 ENCOUNTER — Telehealth: Payer: Self-pay | Admitting: Family Medicine

## 2019-09-23 ENCOUNTER — Other Ambulatory Visit: Payer: Self-pay

## 2019-09-23 ENCOUNTER — Encounter: Payer: Self-pay | Admitting: Physical Therapy

## 2019-09-23 ENCOUNTER — Ambulatory Visit: Payer: Managed Care, Other (non HMO) | Admitting: Physical Therapy

## 2019-09-23 DIAGNOSIS — R262 Difficulty in walking, not elsewhere classified: Secondary | ICD-10-CM | POA: Diagnosis not present

## 2019-09-23 DIAGNOSIS — M62830 Muscle spasm of back: Secondary | ICD-10-CM

## 2019-09-23 DIAGNOSIS — E291 Testicular hypofunction: Secondary | ICD-10-CM

## 2019-09-23 DIAGNOSIS — G8929 Other chronic pain: Secondary | ICD-10-CM

## 2019-09-23 DIAGNOSIS — N529 Male erectile dysfunction, unspecified: Secondary | ICD-10-CM

## 2019-09-23 MED ORDER — TESTOSTERONE CYPIONATE 100 MG/ML IJ SOLN: 100.0000 mg | INTRAMUSCULAR | 0 refills | Status: DC

## 2019-09-23 NOTE — Telephone Encounter (Signed)
Please see message and advise.  Thank you. ° °

## 2019-09-23 NOTE — Telephone Encounter (Signed)
Patient picked up the Viagra prescription but was also expecting another prescription for low testosterone. He stated there was not a second prescription for him at the pharmacy. Please call him to advise.

## 2019-09-23 NOTE — Telephone Encounter (Signed)
Pt was rx'd viagra for ED. Testosterone level was on the lower end of normal. I did not plan to Rx anything for at this time other than the viagra. If that is not effective, he should let me know.

## 2019-09-23 NOTE — Telephone Encounter (Signed)
Please see TE.

## 2019-09-23 NOTE — Addendum Note (Signed)
Addended by: Ronnald Nian on: 09/23/2019 05:06 PM   Modules accepted: Orders

## 2019-09-23 NOTE — Therapy (Addendum)
[place], [name], [address] Phone: [phone]   Fax:  [phone]  Physical Therapy Treatment/Discharge   Patient Details  Name: Marcus Armstrong MRN: [id number] Date of Birth: 12/04/1965 Referring Provider (PT): Dr Earnie Larsson     Encounter Date: 09/23/2019   PT End of Session - 09/23/19 1343    Visit Number 11    Number of Visits 19    Date for PT Re-Evaluation 09/23/19    Authorization Type [employer] 20% after deductable    PT Start Time 1155   Patient 10 minutes late   PT Stop Time 1230    PT Time Calculation (min) 35 min    Activity Tolerance Patient tolerated treatment well    Behavior During Therapy [facility] for tasks assessed/performed           Past Medical History:  Diagnosis Date  . GERD (gastroesophageal reflux disease)   . History of kidney stones 2016  . Hypertension   . Neurofibromatosis ([place])   . Sleep apnea     Past Surgical History:  Procedure Laterality Date  . CARDIAC CATHETERIZATION  02/27/2012   [facility]): Normal coronaries aside from mLAD myocardial bridging verus coronary spasm, LV > 65%  . COLON SURGERY     colonoscpy with polyp resection  . polyp removal      There were no vitals filed for this visit.   Subjective Assessment - 09/23/19 1220    Subjective Patient has been going back to the gym. He worked out hard yesterday and he is sore today.    Limitations Standing;Walking    How long can you stand comfortably? limited time whie perfroming daily tasks    How long can you walk comfortably? limited community distances. Can have cramping in the right leg.    Currently in Pain? Yes    Pain Score 5     Pain Location Hip    Pain Orientation Right;Left    Pain Descriptors / Indicators Aching    Pain Type Chronic pain    Pain Onset 1 to 4 weeks ago    Pain Frequency Constant    Aggravating Factors  inactivity    Pain Relieving Factors stretches    Effect of  Pain on Daily Activities difficulty perfroming                             [facility] Adult PT Treatment/Exercise - 09/23/19 0001      Lumbar Exercises: Stretches   Active Hamstring Stretch Limitations reviewed for the car seated 3x20 secd hold right    Lower Trunk Rotation Limitations x20     Piriformis Stretch 3 reps;30 seconds      Lumbar Exercises: Supine   AB Set Limitations reviewed abdominal breathing     Pelvic Tilt Limitations x15 with breathing     Other Supine Lumbar Exercises green band clam x 20 , right side only     Other Supine Lumbar Exercises supine march x20       Manual Therapy   Manual Therapy Soft tissue mobilization;Joint mobilization    Manual therapy comments skilled palpation of truigger points     Joint Mobilization PA mobilization to lower lumbar spine from L3 -L5     Soft tissue mobilization STYM to lumbar and gluteals  spine             Trigger Point Dry Needling - 09/23/19 0001  Consent Given? Yes    Education Handout Provided Yes    Muscles Treated Back/Hip Gluteus medius;Piriformis    Dry Needling Comments 100 .30 needle to two spots in glut medius and 1 spot in the piriformis     Gluteus Medius Response Twitch response elicited;Palpable increased muscle length    Piriformis Response Twitch response elicited;Palpable increased muscle length                PT Education - 09/23/19 1228    Education Details beneftis and risks of TPDN    Person(s) Educated Patient    Methods Explanation;Demonstration;Tactile cues;Verbal cues    Comprehension Verbalized understanding;Returned demonstration;Verbal cues required;Tactile cues required            PT Short Term Goals - 09/02/19 1216      PT SHORT TERM GOAL #1   Title Patient will increase lumbar flexion by 20 degrees    Baseline 40    Time 3    Period Weeks    Status Achieved    Target Date 08/01/19      PT SHORT TERM GOAL #2   Title Patient will demonstrate 4+/5  right LE strength    Time 3    Period Weeks    Status On-going    Target Date 08/01/19      PT SHORT TERM GOAL #3   Title Patient will be independent with basic stretching and strengthening program    Baseline perfroming at home    Time 3    Period Weeks    Status On-going    Target Date 08/01/19             PT Long Term Goals - 07/11/19 1317      PT LONG TERM GOAL #1   Title Patient will stand for 45 min without self report of increased lower back pain and spasming in order to perfrom ADL's    Time 6    Period Weeks    Status New    Target Date 08/22/19      PT LONG TERM GOAL #2   Title Patient will sleep through the night without increased lower back pain.    Time 6    Period Weeks    Status New    Target Date 08/22/19      PT LONG TERM GOAL #3   Title Patient will deomnstrate a 55% limitation on FOTO    Time 6    Period Weeks    Status New    Target Date 08/22/19                 Plan - 09/23/19 1344    Clinical Impression Statement Great twtich respose to needling of the pirfiromis and gluteals. Therare are multiple spots that would benefit but therapy trialed 3 today. If patient does well we can try a few more. He is not sure if he wants to try needling again. He is reaching max benefit from therapy. if he does not wish to continue needling we will likely discharge him to continue with self stretching and exercise at the gym. Therapy will assess plan next visit.    Personal Factors and Comorbidities Comorbidity 1;Comorbidity 2;Fitness    Comorbidities left menical tear; right IT band syndrome ( history ), 20 lb increase in weight since surgery    Examination-Activity Limitations Bend;Carry;Lift;Locomotion Level;Reach Overhead;Squat    Examination-Participation Restrictions Cleaning;Community Activity;Yard Work    Merchant [employer] officer Evolving/Moderate complexity    Clinical  Decision Making Moderate    Rehab Potential Good    PT Frequency  2x / week    PT Duration 6 weeks    PT Treatment/Interventions ADLs/Self Care Home Management;Electrical Stimulation;Iontophoresis 43m/ml Dexamethasone;Moist Heat;Traction;Ultrasound;DME Instruction;Stair training;Functional mobility training;Gait training;Therapeutic activities;Therapeutic exercise;Neuromuscular re-education;Patient/family education;Manual techniques;Dry needling;Passive range of motion;Taping    PT Next Visit Plan continue pelvic floor strengthening; soft tissue mobilization to lumbar spine. Will likely need to be perfromed in side lying since he has difficulty lying prone. Consider LAD if he can toerate. Side lying spine mobilization if able. patient was iven seated clams shelles. Review abdominal breathing; progress exercises as tolerated. Consider standing exercises. Modlaities PRN; review stretches; add glute stretch when able    PT Home Exercise Plan tennis ball trigger point release; seated hamstring stretch with minimal range, seated t-band clamshell, verbal cues to recruit pelvic floor with transition and stabilization exercises.    Consulted and Agree with Plan of Care Patient           Patient will benefit from skilled therapeutic intervention in order to improve the following deficits and impairments:  Abnormal gait, Decreased mobility, Decreased strength, Postural dysfunction, Pain, Increased muscle spasms, Decreased activity tolerance, Decreased endurance, Increased fascial restricitons, Difficulty walking, Decreased range of motion  Visit Diagnosis: Difficulty in walking, not elsewhere classified  Chronic bilateral low back pain without sciatica  Muscle spasm of back  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 Lumbar foraminal stenosis 03/14/2019  . Congenital spinal stenosis of lumbar region 10/28/2018  . 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 Torn meniscus 09/23/2018  . It band  syndrome, right 08/18/2018  . Personal history of colonic polyps 07/28/2018  . Essential hypertension 07/28/2018  . Gastroesophageal reflux disease 07/28/2018  . Infl[place] of right [place]iliac [place] ([place] 05/08/[place]  . 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 Pain in left knee 05/15/2015   PHYSICAL THERAPY DISCHARGE SUMMARY  Visits from Start of Care: 11  Current functional level related to goals / functional outcomes: Improved aility to stand and walk   Remaining deficits: Pain when he satands for too long    Education / Equipment: HEP   Plan: Patient agrees to discharge.  Patient goals were partially met. Patient is being discharged due to being pleased with the current functional level.  ?????      DCarney LivingPT DPT  09/23/2019, 1:47 PM  [facility] [address]Phone: [phone]  Fax:  [phone] Name: KTomoki LuckenMRN: [id number] of Birth: [birth year]

## 2019-09-23 NOTE — Telephone Encounter (Signed)
I spoke with pt to inform him of Dr. Lurline Del message below.  Pt just wanted Dr. Loletha Grayer to know that he was under the impression of taking something to fix the issues of being on the low end of normal.  Please advise.

## 2019-09-24 ENCOUNTER — Encounter: Payer: Self-pay | Admitting: Physical Therapy

## 2019-09-26 ENCOUNTER — Encounter: Payer: Self-pay | Admitting: Physical Therapy

## 2019-09-27 ENCOUNTER — Ambulatory Visit: Payer: Managed Care, Other (non HMO) | Admitting: Physical Therapy

## 2019-09-28 ENCOUNTER — Ambulatory Visit: Payer: Managed Care, Other (non HMO) | Admitting: Family Medicine

## 2019-10-05 ENCOUNTER — Telehealth: Payer: Self-pay | Admitting: Neurology

## 2019-10-05 ENCOUNTER — Encounter: Payer: Self-pay | Admitting: Family Medicine

## 2019-10-05 ENCOUNTER — Ambulatory Visit: Payer: Managed Care, Other (non HMO) | Admitting: Neurology

## 2019-10-05 DIAGNOSIS — E291 Testicular hypofunction: Secondary | ICD-10-CM

## 2019-10-05 DIAGNOSIS — N529 Male erectile dysfunction, unspecified: Secondary | ICD-10-CM

## 2019-10-05 NOTE — Telephone Encounter (Signed)
Patient returned call to [place].

## 2019-10-05 NOTE — Telephone Encounter (Signed)
Patient called in wanting to speak with a nurse. Did not give any details.

## 2019-10-05 NOTE — Telephone Encounter (Signed)
Spoke with pt who verbalized frustration that he didn't get to speak with someone other than the front desk staff. He verbalized understanding of the cancellation policy and accepted an apology for not getting to discuss this with someone face to face. Pt understands that he will not be charged for this visit but these charges will apply in the future.

## 2019-10-07 MED ORDER — TESTOSTERONE CYPIONATE 100 MG/ML IJ SOLN: 100.0000 mg | INTRAMUSCULAR | 0 refills | Status: DC

## 2019-10-21 IMAGING — MR MRI LUMBAR SPINE WITHOUT AND WITH CONTRAST
4 of 7 series · 14 of 48 positions shown · IV contrast (15 ml multihance)
Comparison: No prior studies available for review.

CLINICAL DATA: History of neurofibromatosis. Prior MRIs for spinal
neurofibroma. Right leg weakness.

EXAM:
MRI LUMBAR SPINE WITHOUT AND WITH CONTRAST
TECHNIQUE: Multiplanar and multiecho pulse sequences of the lumbar spine were
obtained without and with intravenous contrast.
CONTRAST:  15mL MULTIHANCE GADOBENATE DIMEGLUMINE 529 MG/ML IV SOLN

[Series 9: T1 · sagittal · 4.0mm · 0.44mm/px · 3 of 13 slices shown]
[im 1/13]
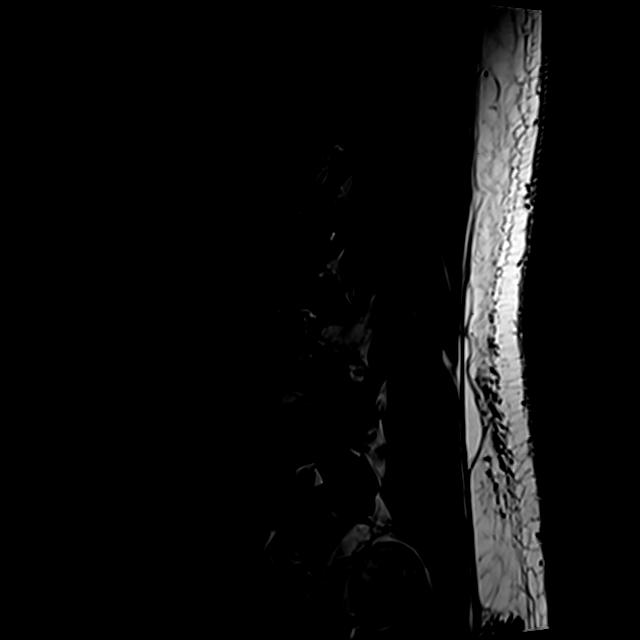
[im 7/13]
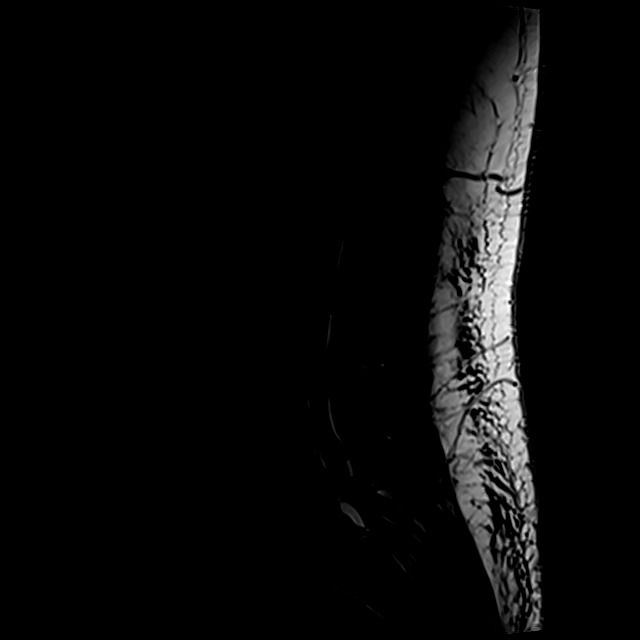
[im 13/13]
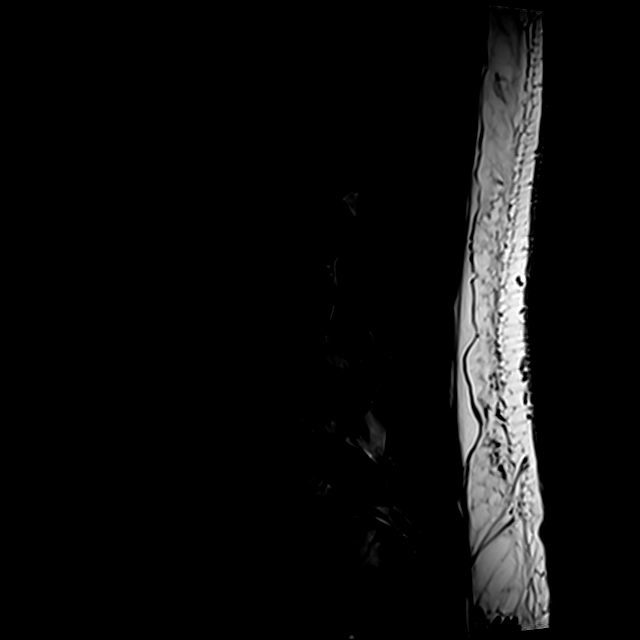

[Series 13: T2 · axial · 4.0mm · 0.35mm/px · z∈[-691,-493]mm · 5 of 39 slices shown (1 of 2)]
[im 1/39]
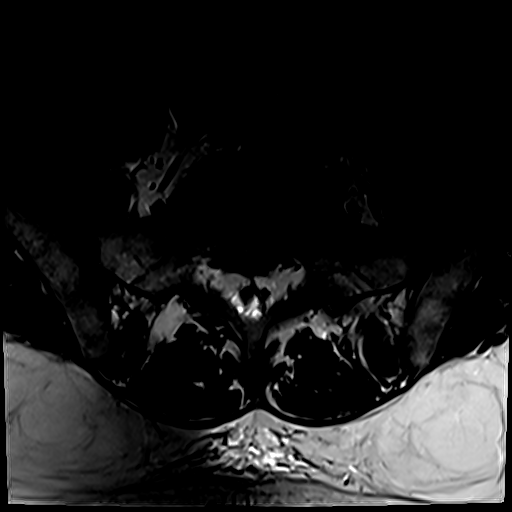
[im 4/39]
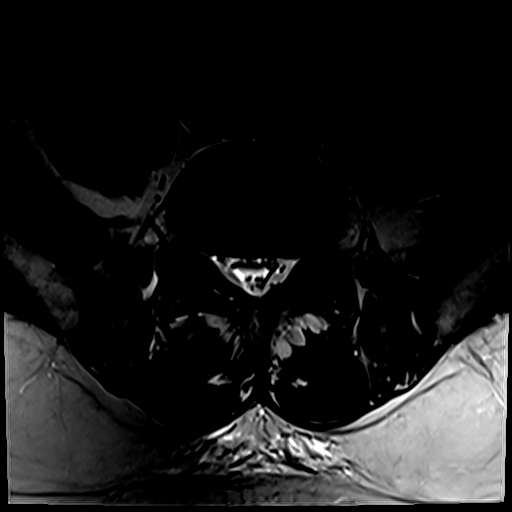
[im 8/39]
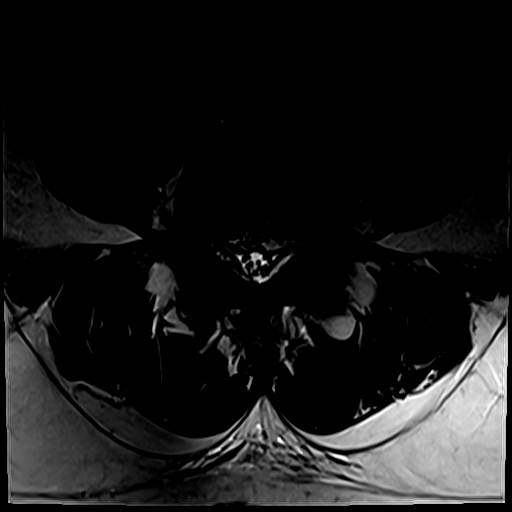
[im 20/39]
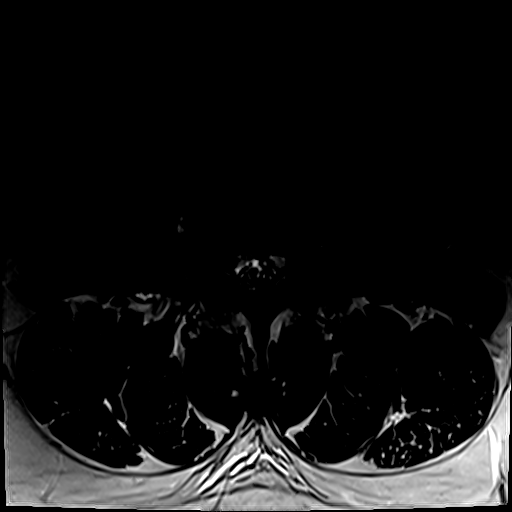
[im 35/39]
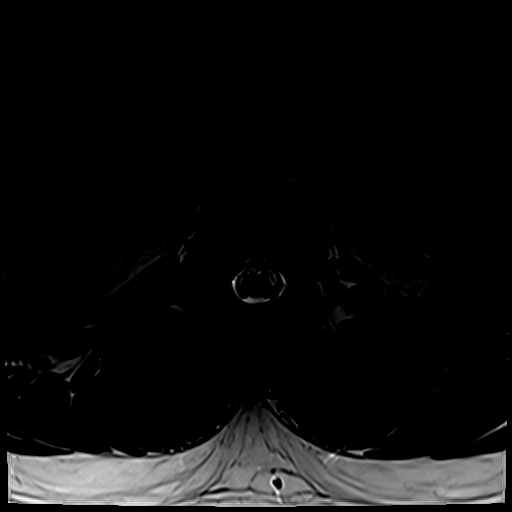

[Series 16: T2 · sagittal · 4.0mm · 0.44mm/px · 3 of 13 slices shown (2 of 2)]
[im 1/13]
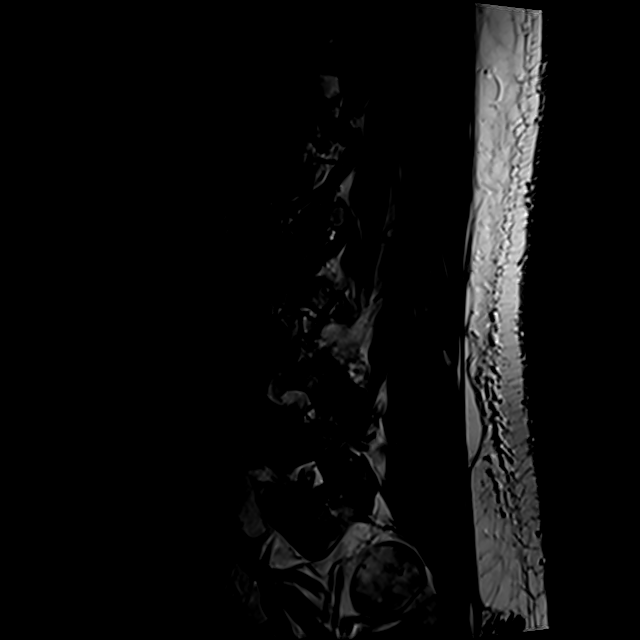
[im 9/13]
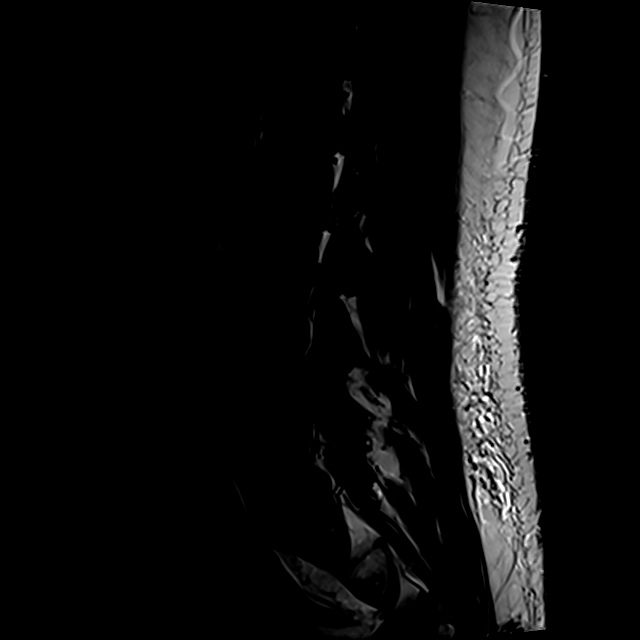
[im 13/13]
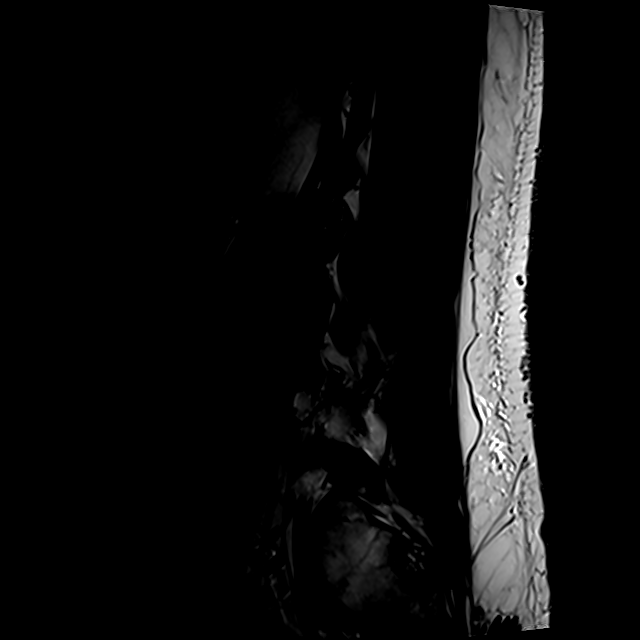

[Series 17: T1 fat-sat post-contrast · sagittal · 4.0mm · 0.44mm/px · 3 of 13 slices shown]
[im 1/13]
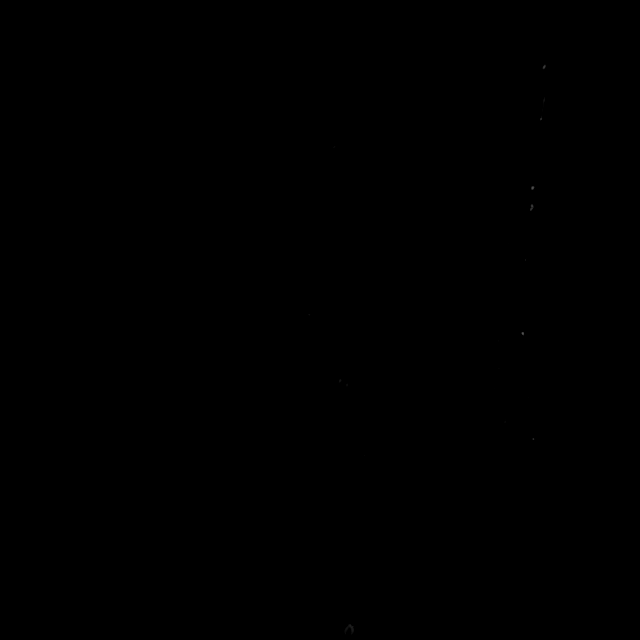
[im 9/13]
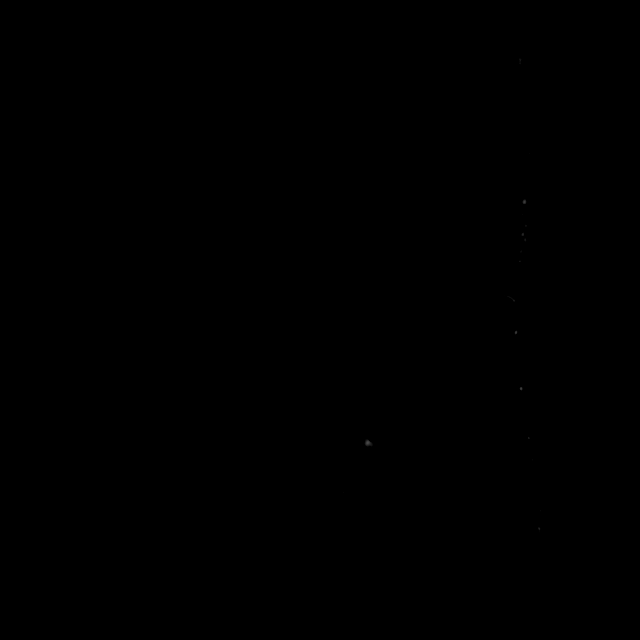
[im 13/13]
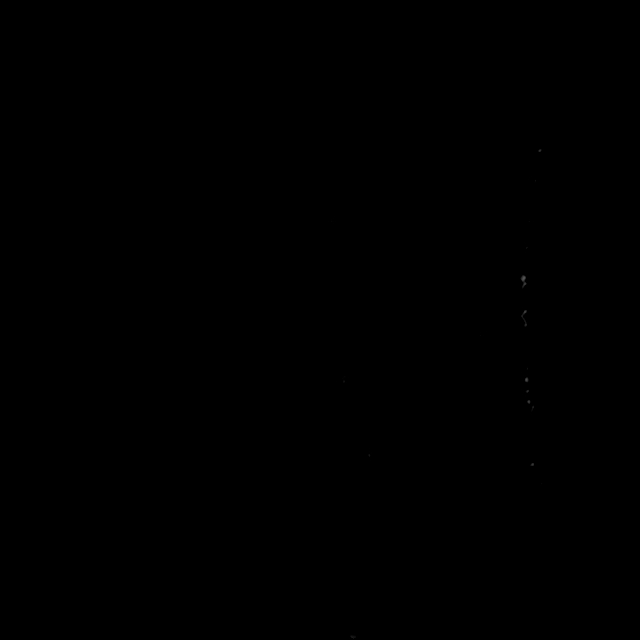

[14 of 48 positions shown; findings below may reference images not displayed]

FINDINGS: Segmentation:  Normal

Alignment:  Normal

Vertebrae:  Normal bone marrow.  Negative for fracture or mass.

Conus medullaris and cauda equina: Conus extends to the L2 level.
Conus medullaris normal. Numerous enhancing nodules in the cauda
equina beginning at L1 and extending into the sacrum. Largest
enhancing lesion measures approximately 6 mm at L4 on the right. No
lesions extend into the neural foramina.

Paraspinal and other soft tissues: Negative for retroperitoneal mass
or adenopathy. No fluid collection.

Disc levels:

Congenitally small lumbar spinal canal.

L1-2: Negative

L2-3: Mild congenital stenosis. Mild facet degeneration. No
significant disc degeneration

L3-4: Mild to moderate spinal stenosis. Disc bulging and facet
hypertrophy.

L4-5: Moderate spinal stenosis due to congenitally small canal as
well as disc degeneration and moderate to advanced facet
degeneration bilaterally. Moderate subarticular stenosis
bilaterally.

L5-S1: Disc degeneration and mild disc bulging. Bilateral facet
degeneration.
IMPRESSION: Numerous enhancing nodules along the cauda equina throughout the
lumbar spine compatible with given history of neurofibromatosis.
Without the benefit of prior studies and history, the appearance
could be seen with drop metastasis.

Congenital lumbar stenosis. Superimposed degenerative changes in the
lumbar spine with mild to moderate spinal stenosis at L3-4 and
moderate spinal stenosis at L4-5.

## 2019-10-27 ENCOUNTER — Encounter: Payer: Self-pay | Admitting: Family Medicine

## 2019-10-28 ENCOUNTER — Ambulatory Visit (INDEPENDENT_AMBULATORY_CARE_PROVIDER_SITE_OTHER): Payer: Managed Care, Other (non HMO) | Admitting: Family Medicine

## 2019-10-28 ENCOUNTER — Other Ambulatory Visit: Payer: Self-pay

## 2019-10-28 ENCOUNTER — Encounter: Payer: Self-pay | Admitting: Family Medicine

## 2019-10-28 VITALS — BP 138/82 | HR 76 | Temp 97.5°F | Ht 72.0 in | Wt 265.6 lb

## 2019-10-28 DIAGNOSIS — M25562 Pain in left knee: Secondary | ICD-10-CM

## 2019-10-28 DIAGNOSIS — J309 Allergic rhinitis, unspecified: Secondary | ICD-10-CM | POA: Diagnosis not present

## 2019-10-28 DIAGNOSIS — F419 Anxiety disorder, unspecified: Secondary | ICD-10-CM

## 2019-10-28 MED ORDER — PREDNISONE 20 MG PO TABS: ORAL_TABLET | ORAL | 0 refills | Status: DC

## 2019-10-28 MED ORDER — DIAZEPAM 5 MG PO TABS: 5.0000 mg | ORAL_TABLET | Freq: Four times a day (QID) | ORAL | 0 refills | Status: DC | PRN

## 2019-10-28 MED ORDER — FLUTICASONE PROPIONATE 50 MCG/ACT NA SUSP: 2.0000 | Freq: Every day | NASAL | 2 refills | Status: AC | PRN

## 2019-10-28 MED ORDER — NABUMETONE 500 MG PO TABS: 500.0000 mg | ORAL_TABLET | Freq: Two times a day (BID) | ORAL | 0 refills | Status: DC

## 2019-10-28 NOTE — Progress Notes (Signed)
Marcus Armstrong is a 54 y.o. male  Chief Complaint  Patient presents with  . Pain    left knee swelling and painful-swelling behind the knee-pt states been going on for past couple of weeks//pt needs work note for yesterday and today-urgent care monday got some injection//use ibuprofen and ice an helps//refills Valium and flonase    HPI: Marcus Armstrong is a 54 y.o. male complains of Lt knee pain and swelling x few weeks. He was seen at [place] on Monday, given steroid injection after draining 44cc of fluid. He also had xray done and pt states he was told he has underlying arthritis. He has also been taking ibuprofen and ice which have helped. He also bought a compression sleeve which helps.  Knee feels like it could buckle but it has not and he has not fallen.  He needs a work note for yesterday and today.   Past Medical History:  Diagnosis Date  . GERD (gastroesophageal reflux disease)   . History of kidney stones 2016  . Hypertension   . Neurofibromatosis ([place])   . Sleep apnea     Past Surgical History:  Procedure Laterality Date  . CARDIAC CATHETERIZATION  02/27/2012   [facility] Normal coronaries aside from mLAD myocardial bridging verus coronary spasm, LV > 65%  . COLON SURGERY     colonoscpy with polyp resection  . polyp removal      Social History   Socioeconomic History  . Marital status: Divorced    Spouse name: Not on file  . Number of children: Not on file  . Years of education: Not on file  . Highest education level: Not on file  Occupational History  . Not on file  Tobacco Use  . Smoking status: Former Smoker    Packs/day: 0.25    Years: 30.00    Pack years: 7.50    Types: Cigarettes    Quit date: 06/06/2018    Years since quitting: 1.3  . Smokeless tobacco: Never Used  Vaping Use  . Vaping Use: Never used  Substance and Sexual Activity  . Alcohol use: Yes    Comment: occasional beer  . 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 Sexual activity: Not on file  Other Topics Concern  . Not on file  Social History Narrative   Right handed    Social Determinants of Health   Financial Resource Strain:   . Difficulty of Paying Living Expenses:   Food Insecurity:   . Worried About Charity fundraiser in the Last Year:   . Arboriculturist in the Last Year:   Transportation Needs:   . Film/video editor (Medical):   Marland Kitchen Lack of Transportation (Non-Medical):   Physical Activity:   . Days of Exercise per Week:   . Minutes of Exercise per Session:   Stress:   . Feeling of Stress :   Social Connections:   . Frequency of Communication with Friends and Family:   . Frequency of Social Gatherings with Friends and Family:   . Attends Religious Services:   . Active Member of Clubs or Organizations:   . Attends Archivist Meetings:   Marland Kitchen Marital Status:   Intimate Partner Violence:   . Fear of Current or Ex-Partner:   . Emotionally Abused:   Marland Kitchen Physically Abused:   . Sexually Abused:  Family History  Problem Relation Age of Onset  . Cancer Father 17       prostate     Immunization History  Administered Date(s) Administered  . PFIZER SARS-COV-2 Vaccination 08/29/2019, 09/19/2019  . Tdap 06/01/2019    Outpatient Encounter Medications as of 10/28/2019  Medication Sig  . amLODipine (NORVASC) 10 MG tablet Take 1 tablet (10 mg total) by mouth daily.  . Cannabidiol POWD Apply 1 application topically 3 (three) times daily as needed (pain.). CBD Pain Relief Roll-O  . cetirizine (ZYRTEC) 10 MG tablet Take 10 mg by mouth every other day.   . diazepam (VALIUM) 5 MG tablet Take 1-2 tablets (5-10 mg total) by mouth every 6 (six) hours as needed for muscle spasms.  . Diclofenac Sodium (PENNSAID) 2 % SOLN Place 1 application onto the skin 2 (two) times daily. (Patient taking differently: Place 1 application onto the skin 2 (two) times  daily as needed (pain.). )  . fluticasone (FLONASE) 50 MCG/ACT nasal spray Place 1-2 sprays into both nostrils daily as needed (allergies.).   Marland Kitchen gabapentin (NEURONTIN) 300 MG capsule TAKE ONE CAPSULE BY MOUTH THREE TIMES DAILY  . ibuprofen (ADVIL) 800 MG tablet Take 1 tablet (800 mg total) by mouth every 8 (eight) hours as needed. (Patient taking differently: Take 800 mg by mouth every 8 (eight) hours as needed (pain). )  . ketoconazole (NIZORAL) 2 % shampoo Apply 1 application topically 2 (two) times a week.  . losartan-hydrochlorothiazide (HYZAAR) 100-25 MG tablet Take 1 tablet by mouth daily.  Marland Kitchen omeprazole (PRILOSEC) 20 MG capsule Take 1 capsule (20 mg total) by mouth 2 (two) times daily before a meal. (Patient taking differently: Take 20 mg by mouth 2 (two) times daily as needed (acid reflux/indigestion.). )  . sildenafil (VIAGRA) 50 MG tablet Take 1 tablet (50 mg total) by mouth daily as needed for erectile dysfunction.  Marland Kitchen testosterone cypionate (DEPOTESTOSTERONE CYPIONATE) 200 MG/ML injection SMARTSIG:0.5 Milliliter(s) IM Every 2 Weeks  . Testosterone Cypionate 100 MG/ML SOLN Inject 100 mg as directed every 14 (fourteen) days.   No facility-administered encounter medications on file as of 10/28/2019.     ROS: Pertinent positives and negatives noted in HPI. Remainder of ROS non-contributory    Allergies  Allergen Reactions  . Cat Hair Extract Hives, Shortness Of Breath, Swelling, Rash and Other (See Comments)  . Shellfish Allergy Shortness Of Breath, Itching and Swelling    Eye ball swelling (when not eating fresh seafood, breathing problems)    BP (!) 138/82   Pulse 76   Temp (!) 97.5 F (36.4 C) (Tympanic)   Ht 6' (1.829 m)   Wt (!) 265 lb 9.6 oz (120.5 kg)   SpO2 97%   BMI 36.02 kg/m   Physical Exam Constitutional:      General: He is not in acute distress.    Appearance: He is not ill-appearing.  Musculoskeletal:     Left knee: Swelling and effusion present. No  erythema, bony tenderness or crepitus. Tenderness present over the lateral joint line. Normal pulse.  Neurological:     Mental Status: He is alert and oriented to person, place, and time.  Psychiatric:        Mood and Affect: Mood normal.        Behavior: Behavior normal.      A/P:  1. Acute pain of left knee - seen at [place] on Monday - xray, steroid injection; improved but after going up/down stairs and being on  it at work, pt notes increased pain, some swelling Rx: - nabumetone (RELAFEN) 500 MG tablet; Take 1 tablet (500 mg total) by mouth 2 (two) times daily.  Dispense: 60 tablet; Refill: 0 - predniSONE (DELTASONE) 20 MG tablet; 3 tabs po x 3 days, 2 tabs po x 3 days, 1 tab po x 3 days, 1/2 tab po x 3 days  Dispense: 20 tablet; Refill: 0 - Ambulatory referral to Orthopedic Surgery - pt to schedule f/u visit with Raliegh Ip  2. Anxiety - stable, controlled - database reviewed and appropriate Refill: - diazepam (VALIUM) 5 MG tablet; Take 1-2 tablets (5-10 mg total) by mouth every 6 (six) hours as needed for muscle spasms.  Dispense: 30 tablet; Refill: 0  3. Allergic rhinitis, unspecified seasonality, unspecified trigger Refill: - fluticasone (FLONASE) 50 MCG/ACT nasal spray; Place 2 sprays into both nostrils daily as needed (allergies.).  Dispense: 16 g; Refill: 2    This visit occurred during the SARS-CoV-2 public health emergency.  Safety protocols were in place, including screening questions prior to the visit, additional usage of staff PPE, and extensive cleaning of exam room while observing appropriate contact time as indicated for disinfecting solutions.

## 2019-10-28 NOTE — Patient Instructions (Signed)
Journal for Nurse Practitioners, 15(4), 263-267. Retrieved January 11, 2018 from [email or web address].[email or web address]">  Knee Exercises Ask your health care provider which exercises are safe for you. Do exercises exactly as told by your health care provider and adjust them as directed. It is normal to feel mild stretching, pulling, tightness, or discomfort as you do these exercises. Stop right away if you feel sudden pain or your pain gets worse. Do not begin these exercises until told by your health care provider. Stretching and range-of-motion exercises These exercises warm up your muscles and joints and improve the movement and flexibility of your knee. These exercises also help to relieve pain and swelling. Knee extension, prone 1. Lie on your abdomen (prone position) on a bed. 2. Place your left / right knee just beyond the edge of the surface so your knee is not on the bed. You can put a towel under your left / right thigh just above your kneecap for comfort. 3. Relax your leg muscles and allow gravity to straighten your knee (extension). You should feel a stretch behind your left / right knee. 4. Hold this position for __________ seconds. 5. Scoot up so your knee is supported between repetitions. Repeat __________ times. Complete this exercise __________ times a day. Knee flexion, active  1. Lie on your back with both legs straight. If this causes back discomfort, bend your left / right knee so your foot is flat on the floor. 2. Slowly slide your left / right heel back toward your buttocks. Stop when you feel a gentle stretch in the front of your knee or thigh (flexion). 3. Hold this position for __________ seconds. 4. Slowly slide your left / right heel back to the starting position. Repeat __________ times. Complete this exercise __________ times a day. Quadriceps stretch, prone  1. Lie on your abdomen on a firm surface, such as a bed or padded floor. 2. Bend your left / right knee and hold  your ankle. If you cannot reach your ankle or pant leg, loop a belt around your foot and grab the belt instead. 3. Gently pull your heel toward your buttocks. Your knee should not slide out to the side. You should feel a stretch in the front of your thigh and knee (quadriceps). 4. Hold this position for __________ seconds. Repeat __________ times. Complete this exercise __________ times a day. Hamstring, supine 1. Lie on your back (supine position). 2. Loop a belt or towel over the ball of your left / right foot. The ball of your foot is on the walking surface, right under your toes. 3. Straighten your left / right knee and slowly pull on the belt to raise your leg until you feel a gentle stretch behind your knee (hamstring). ? Do not let your knee bend while you do this. ? Keep your other leg flat on the floor. 4. Hold this position for __________ seconds. Repeat __________ times. Complete this exercise __________ times a day. Strengthening exercises These exercises build strength and endurance in your knee. Endurance is the ability to use your muscles for a long time, even after they get tired. Quadriceps, isometric This exercise stretches the muscles in front of your thigh (quadriceps) without moving your knee joint (isometric). 1. Lie on your back with your left / right leg extended and your other knee bent. Put a rolled towel or small pillow under your knee if told by your health care provider. 2. Slowly tense the muscles in the front of your left /   right thigh. You should see your kneecap slide up toward your hip or see increased dimpling just above the knee. This motion will push the back of the knee toward the floor. 3. For __________ seconds, hold the muscle as tight as you can without increasing your pain. 4. Relax the muscles slowly and completely. Repeat __________ times. Complete this exercise __________ times a day. Straight leg raises This exercise stretches the muscles in front  of your thigh (quadriceps) and the muscles that move your hips (hip flexors). 1. Lie on your back with your left / right leg extended and your other knee bent. 2. Tense the muscles in the front of your left / right thigh. You should see your kneecap slide up or see increased dimpling just above the knee. Your thigh may even shake a bit. 3. Keep these muscles tight as you raise your leg 4-6 inches (10-15 cm) off the floor. Do not let your knee bend. 4. Hold this position for __________ seconds. 5. Keep these muscles tense as you lower your leg. 6. Relax your muscles slowly and completely after each repetition. Repeat __________ times. Complete this exercise __________ times a day. Hamstring, isometric 1. Lie on your back on a firm surface. 2. Bend your left / right knee about __________ degrees. 3. Dig your left / right heel into the surface as if you are trying to pull it toward your buttocks. Tighten the muscles in the back of your thighs (hamstring) to "dig" as hard as you can without increasing any pain. 4. Hold this position for __________ seconds. 5. Release the tension gradually and allow your muscles to relax completely for __________ seconds after each repetition. Repeat __________ times. Complete this exercise __________ times a day. Hamstring curls If told by your health care provider, do this exercise while wearing ankle weights. Begin with __________ lb weights. Then increase the weight by 1 lb (0.5 kg) increments. Do not wear ankle weights that are more than __________ lb. 1. Lie on your abdomen with your legs straight. 2. Bend your left / right knee as far as you can without feeling pain. Keep your hips flat against the floor. 3. Hold this position for __________ seconds. 4. Slowly lower your leg to the starting position. Repeat __________ times. Complete this exercise __________ times a day. Squats This exercise strengthens the muscles in front of your thigh and knee  (quadriceps). 1. Stand in front of a table, with your feet and knees pointing straight ahead. You may rest your hands on the table for balance but not for support. 2. Slowly bend your knees and lower your hips like you are going to sit in a chair. ? Keep your weight over your heels, not over your toes. ? Keep your lower legs upright so they are parallel with the table legs. ? Do not let your hips go lower than your knees. ? Do not bend lower than told by your health care provider. ? If your knee pain increases, do not bend as low. 3. Hold the squat position for __________ seconds. 4. Slowly push with your legs to return to standing. Do not use your hands to pull yourself to standing. Repeat __________ times. Complete this exercise __________ times a day. Wall slides This exercise strengthens the muscles in front of your thigh and knee (quadriceps). 1. Lean your back against a smooth wall or door, and walk your feet out 18-24 inches (46-61 cm) from it. 2. Place your feet hip-width apart. 3.   Slowly slide down the wall or door until your knees bend __________ degrees. Keep your knees over your heels, not over your toes. Keep your knees in line with your hips. 4. Hold this position for __________ seconds. Repeat __________ times. Complete this exercise __________ times a day. Straight leg raises This exercise strengthens the muscles that rotate the leg at the hip and move it away from your body (hip abductors). 1. Lie on your side with your left / right leg in the top position. Lie so your head, shoulder, knee, and hip line up. You may bend your bottom knee to help you keep your balance. 2. Roll your hips slightly forward so your hips are stacked directly over each other and your left / right knee is facing forward. 3. Leading with your heel, lift your top leg 4-6 inches (10-15 cm). You should feel the muscles in your outer hip lifting. ? Do not let your foot drift forward. ? Do not let your knee  roll toward the ceiling. 4. Hold this position for __________ seconds. 5. Slowly return your leg to the starting position. 6. Let your muscles relax completely after each repetition. Repeat __________ times. Complete this exercise __________ times a day. Straight leg raises This exercise stretches the muscles that move your hips away from the front of the pelvis (hip extensors). 1. Lie on your abdomen on a firm surface. You can put a pillow under your hips if that is more comfortable. 2. Tense the muscles in your buttocks and lift your left / right leg about 4-6 inches (10-15 cm). Keep your knee straight as you lift your leg. 3. Hold this position for __________ seconds. 4. Slowly lower your leg to the starting position. 5. Let your leg relax completely after each repetition. Repeat __________ times. Complete this exercise __________ times a day. This information is not intended to replace advice given to you by your health care provider. Make sure you discuss any questions you have with your health care provider. Document Revised: 01/12/2018 Document Reviewed: 01/12/2018 Elsevier Patient Education  2020 [employer]  

## 2019-12-30 ENCOUNTER — Encounter: Payer: Self-pay | Admitting: Family Medicine

## 2020-01-04 ENCOUNTER — Encounter: Payer: Self-pay | Admitting: Family Medicine

## 2020-01-04 ENCOUNTER — Telehealth (INDEPENDENT_AMBULATORY_CARE_PROVIDER_SITE_OTHER): Payer: Managed Care, Other (non HMO) | Admitting: Family Medicine

## 2020-01-04 VITALS — Temp 97.8°F | Ht 72.0 in | Wt 250.0 lb

## 2020-01-04 DIAGNOSIS — N410 Acute prostatitis: Secondary | ICD-10-CM

## 2020-01-04 DIAGNOSIS — I10 Essential (primary) hypertension: Secondary | ICD-10-CM | POA: Diagnosis not present

## 2020-01-04 DIAGNOSIS — J309 Allergic rhinitis, unspecified: Secondary | ICD-10-CM

## 2020-01-04 DIAGNOSIS — N529 Male erectile dysfunction, unspecified: Secondary | ICD-10-CM

## 2020-01-04 MED ORDER — AMLODIPINE BESYLATE 10 MG PO TABS: 10.0000 mg | ORAL_TABLET | Freq: Every day | ORAL | 3 refills | Status: DC

## 2020-01-04 MED ORDER — SILDENAFIL CITRATE 50 MG PO TABS: 50.0000 mg | ORAL_TABLET | Freq: Every day | ORAL | 1 refills | Status: AC | PRN

## 2020-01-04 MED ORDER — CIPROFLOXACIN HCL 500 MG PO TABS: 500.0000 mg | ORAL_TABLET | Freq: Two times a day (BID) | ORAL | 0 refills | Status: AC

## 2020-01-04 MED ORDER — MONTELUKAST SODIUM 10 MG PO TABS: 10.0000 mg | ORAL_TABLET | Freq: Every day | ORAL | 1 refills | Status: AC

## 2020-01-04 MED ORDER — TAMSULOSIN HCL 0.4 MG PO CAPS: 0.4000 mg | ORAL_CAPSULE | Freq: Every day | ORAL | 3 refills | Status: DC
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
## 2020-03-20 ENCOUNTER — Emergency Department (HOSPITAL_COMMUNITY)
Admission: EM | Admit: 2020-03-20 | Discharge: 2020-03-20 | Disposition: A | Discharge status: Home / Self Care | Payer: Managed Care, Other (non HMO) | Attending: Emergency Medicine | Admitting: Emergency Medicine

## 2020-03-20 ENCOUNTER — Other Ambulatory Visit: Payer: Self-pay

## 2020-03-20 ENCOUNTER — Encounter (HOSPITAL_COMMUNITY): Payer: Self-pay | Admitting: Emergency Medicine

## 2020-03-20 DIAGNOSIS — Z79899 Other long term (current) drug therapy: Secondary | ICD-10-CM | POA: Diagnosis not present

## 2020-03-20 DIAGNOSIS — Z87891 Personal history of nicotine dependence: Secondary | ICD-10-CM | POA: Insufficient documentation

## 2020-03-20 DIAGNOSIS — J020 Streptococcal pharyngitis: Secondary | ICD-10-CM | POA: Diagnosis not present

## 2020-03-20 DIAGNOSIS — Z20822 Contact with and (suspected) exposure to covid-19: Secondary | ICD-10-CM | POA: Insufficient documentation

## 2020-03-20 DIAGNOSIS — I10 Essential (primary) hypertension: Secondary | ICD-10-CM | POA: Diagnosis not present

## 2020-03-20 DIAGNOSIS — J029 Acute pharyngitis, unspecified: Secondary | ICD-10-CM | POA: Diagnosis present

## 2020-03-20 LAB — RESP PANEL BY RT-PCR (FLU A&B, COVID) ARPGX2
Influenza A by PCR: NEGATIVE
Influenza B by PCR: NEGATIVE
SARS Coronavirus 2 by RT PCR: NEGATIVE

## 2020-03-20 LAB — GROUP A STREP BY PCR: Group A Strep by PCR: DETECTED — AB

## 2020-03-20 MED ORDER — DIPHENHYDRAMINE HCL 50 MG/ML IJ SOLN
50.0000 mg | Freq: Once | INTRAMUSCULAR | Status: AC
  Administered 2020-03-20: 50 mg via INTRAVENOUS
  Filled 2020-03-20: qty 1

## 2020-03-20 MED ORDER — METHYLPREDNISOLONE SODIUM SUCC 125 MG IJ SOLR
125.0000 mg | Freq: Once | INTRAMUSCULAR | Status: AC
  Administered 2020-03-20: 125 mg via INTRAVENOUS
  Filled 2020-03-20: qty 2

## 2020-03-20 MED ORDER — FAMOTIDINE IN NACL 20-0.9 MG/50ML-% IV SOLN
20.0000 mg | Freq: Once | INTRAVENOUS | Status: AC
  Administered 2020-03-20: 20 mg via INTRAVENOUS
  Filled 2020-03-20: qty 50

## 2020-03-20 MED ORDER — PENICILLIN G BENZATHINE 1200000 UNIT/2ML IM SUSP
1.2000 10*6.[IU] | Freq: Once | INTRAMUSCULAR | Status: AC
  Administered 2020-03-20: 1.2 10*6.[IU] via INTRAMUSCULAR
  Filled 2020-03-20: qty 2

## 2020-03-20 NOTE — ED Provider Notes (Signed)
[facility] EMERGENCY DEPARTMENT Provider Note   CSN: [id number] Arrival date & time: 03/20/20  1653     History Chief Complaint  Patient presents with  . Allergic Reaction    Marcus Armstrong is a 54 y.o. male.  Patient presents to the emergency department for evaluation of sore throat and tightness which started acutely just prior to arrival.  Patient states that he developed the symptoms approximately 30 minutes after eating a sandwich.  He had a swollen throat.  He contacted his doctor who directed him to the emergency department for concern of allergic reaction.  Patient does have a history of reaction to shellfish.  He is not sure what he could have eaten that he is allergic to.  No treatments prior to arrival.  No voice changes, fevers.  He has had some congestion and right ear pain recently.  No associated vomiting or diarrhea.  No skin rashes or hives.  No lightheadedness or syncope.  No chest pain, SOB.         Past Medical History:  Diagnosis Date  . GERD (gastroesophageal reflux disease)   . History of kidney stones 2016  . Hypertension   . Neurofibromatosis ([place])   . Sleep apnea     Patient Active Problem List   Diagnosis Date Noted  . Lumbar foraminal stenosis 03/14/2019  . Congenital spinal stenosis of lumbar region 10/28/2018  . 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 Torn meniscus 09/23/2018  . It band syndrome, right 08/18/2018  . Personal history of colonic polyps 07/28/2018  . Essential hypertension 07/28/2018  . Gastroesophageal reflux disease 07/28/2018  . 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 Pain in left knee 05/15/2015    Past Surgical History:  Procedure Laterality Date  . CARDIAC CATHETERIZATION  02/27/2012   [facility]): Normal coronaries aside from mLAD myocardial bridging verus coronary spasm, LV > 65%  . COLON SURGERY     colonoscpy with polyp resection   . polyp removal         Family History  Problem Relation Age of Onset  . Cancer Father 64       prostate    Social History   Tobacco Use  . Smoking status: Former Smoker    Packs/day: 0.25    Years: 30.00    Pack years: 7.50    Types: Cigarettes    Quit date: 06/06/2018    Years since quitting: 1.7  . Smokeless tobacco: Never Used  Vaping Use  . Vaping Use: Never used  Substance Use Topics  . Alcohol use: Yes    Comment: occasional beer  . 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 Authorizing Provider  amLODipine (NORVASC) 10 MG tablet Take 1 tablet (10 mg total) by mouth daily. 01/04/20   Cirigliano, Garvin Fila, DO  Cannabidiol POWD Apply 1 application topically 3 (three) times daily as needed (pain.). CBD Pain Relief Roll-O    [provider]  cetirizine (ZYRTEC) 10 MG tablet Take 10 mg by mouth every other day.  07/05/18   [provider]  diazepam (VALIUM) 5 MG tablet Take 1-2 tablets (5-10 mg total) by mouth every 6 (six) hours as needed for muscle spasms. 10/28/19   Ronnald Nian, DO  Diclofenac Sodium (PENNSAID) 2 % SOLN Place 1 application onto the skin 2 (two) times daily. Patient taking differently: Place 1 application onto the skin 2 (two) times daily as needed (pain.).  08/18/18   Rosemarie Ax, MD  fluticasone [facility]) 50 MCG/ACT nasal spray Place 2 sprays into both nostrils daily as needed (allergies.). 10/28/19   Cirigliano, Garvin Fila, DO  gabapentin (NEURONTIN) 300 MG capsule TAKE ONE CAPSULE BY MOUTH THREE TIMES DAILY 06/02/19   Cirigliano, Garvin Fila, DO  ibuprofen (ADVIL) 800 MG tablet Take 1 tablet (800 mg total) by mouth every 8 (eight) hours as needed. Patient taking differently: Take 800 mg by mouth every 8 (eight) hours as needed (pain).  01/13/19   Cirigliano, Garvin Fila, DO   ketoconazole (NIZORAL) 2 % shampoo Apply 1 application topically 2 (two) times a week. 06/02/19   Cirigliano, Garvin Fila, DO  losartan-hydrochlorothiazide (HYZAAR) 100-25 MG tablet Take 1 tablet by mouth daily. 08/16/19   Cirigliano, Mary K, DO  montelukast (SINGULAIR) 10 MG tablet Take 1 tablet (10 mg total) by mouth at bedtime. 01/04/20   Cirigliano, Garvin Fila, DO  nabumetone (RELAFEN) 500 MG tablet Take 1 tablet (500 mg total) by mouth 2 (two) times daily. 10/28/19   Cirigliano, Garvin Fila, DO  omeprazole (PRILOSEC) 20 MG capsule Take 1 capsule (20 mg total) by mouth 2 (two) times daily before a meal. Patient taking differently: Take 20 mg by mouth 2 (two) times daily as needed (acid reflux/indigestion.).  08/17/18   Nche, Charlene Brooke, NP  sildenafil (VIAGRA) 50 MG tablet Take 1 tablet (50 mg total) by mouth daily as needed for erectile dysfunction. 01/04/20   Cirigliano, Garvin Fila, DO  tamsulosin (FLOMAX) 0.4 MG CAPS capsule Take 1 capsule (0.4 mg total) by mouth daily. 01/04/20   Cirigliano, Garvin Fila, DO    Allergies    Cat hair extract and Shellfish allergy  Review of Systems   Review of Systems  Constitutional: Negative for fever.  HENT: Positive for congestion, ear pain, sore throat and trouble swallowing. Negative for facial swelling and voice change.   Eyes: Negative for redness.  Respiratory: Negative for shortness of breath, wheezing and stridor.   Cardiovascular: Negative for chest pain.  Gastrointestinal: Negative for nausea and vomiting.  Musculoskeletal: Negative for myalgias.  Skin: Negative for rash.  Neurological: Negative for light-headedness.  Psychiatric/Behavioral: Negative for confusion.    Physical Exam Updated Vital Signs BP 136/82   Pulse 85   Temp 98.7 F (37.1 C) (Oral)   Resp 20   SpO2 97%   Physical Exam Vitals and nursing note reviewed.  Constitutional:      Appearance: He is well-developed and well-nourished.  HENT:     Head: Normocephalic and atraumatic.      Nose: No rhinorrhea.     Mouth/Throat:     Pharynx: Posterior oropharyngeal erythema present.     Comments: Erythema of tonsils bilaterally, mild edema.  No significant oropharyngeal edema or angioedema. Eyes:     General:        Right eye: No discharge.        Left eye: No discharge.     Conjunctiva/sclera: Conjunctivae normal.  Neck:     Comments: Full range of motion of neck.  Patient states that his throat feels tighter when he flexes his neck forward. Cardiovascular:     Rate and Rhythm: Normal rate and regular rhythm.     Heart sounds: Normal heart sounds.  Pulmonary:  Effort: Pulmonary effort is normal.     Breath sounds: Normal breath sounds. No stridor.     Comments: No stridor. Abdominal:     Palpations: Abdomen is soft.     Tenderness: There is no abdominal tenderness. There is no guarding or rebound.  Musculoskeletal:     Cervical back: Normal range of motion and neck supple.  Lymphadenopathy:     Head:     Right side of head: Tonsillar adenopathy present.     Left side of head: Tonsillar adenopathy present.  Skin:    General: Skin is warm and dry.  Neurological:     Mental Status: He is alert.  Psychiatric:        Mood and Affect: Mood and affect normal.     ED Results / Procedures / Treatments   Labs (all labs ordered are listed, but only abnormal results are displayed) Labs Reviewed  GROUP A STREP BY PCR - Abnormal; Notable for the following components:      Result Value   Group A Strep by PCR DETECTED (*)    All other components within normal limits  RESP PANEL BY RT-PCR (FLU A&B, COVID) ARPGX2    EKG None  Radiology No results found.  Procedures Procedures (including critical care time)  Medications Ordered in ED Medications  methylPREDNISolone sodium succinate (SOLU-MEDROL) 125 mg/2 mL injection 125 mg (125 mg Intravenous Given 03/20/20 1736)  famotidine (PEPCID) IVPB 20 mg premix (0 mg Intravenous Stopped 03/20/20 1939)   diphenhydrAMINE (BENADRYL) injection 50 mg (50 mg Intravenous Given 03/20/20 1736)  penicillin g benzathine (BICILLIN LA) 1200000 UNIT/2ML injection 1.2 Million Units (1.2 Million Units Intramuscular Given 03/20/20 2035)    ED Course  I have reviewed the triage vital signs and the nursing notes.  Pertinent labs & imaging results that were available during my care of the patient were reviewed by me and considered in my medical decision making (see chart for details).  Patient seen and examined. Work-up initiated. Medications ordered: solumedrol, pepcid, benadryl. Will monitor closely and reassess.    Vital signs reviewed and are as follows: BP 136/82   Pulse 85   Temp 98.7 F (37.1 C) (Oral)   Resp 20   SpO2 97%   Discussed with Dr. Karle Starch.   Strep +, flu/covid -.   Pt updated.  Patient states that he is feeling better.  He has tolerated water.  Encouraged hydration at home, use of OTC meds for pain.  Encouraged rest for the next few days until he feels better.  Work note given.  Patient urged to return with worsening symptoms, trouble breathing, voice changes or other concerns. Patient verbalized understanding and agrees with plan.     MDM Rules/Calculators/A&P                          Patient seen initially over concern for anaphylaxis.  Patient noted to have inflamed swollen tonsils.  Strep positive.  No concern for acute airway compromise.  Patient appears well, nontoxic.  He received IM Bicillin.  Discharged home with conservative measures.  Return instructions as above.   Final Clinical Impression(s) / ED Diagnoses Final diagnoses:  Acute streptococcal pharyngitis    Rx / DC Orders ED Discharge Orders    None       Suann Larry 03/20/20 2108    Truddie Hidden, MD 03/20/20 [phone]

## 2020-03-20 NOTE — ED Triage Notes (Signed)
Pt to ED with c/o having an allergic reaction 30 mins after eating a club sandwich.  Pt st's his throat feels swollen.  Pt speaking in full sentences.  No resp distress present at this time

## 2020-03-20 NOTE — Discharge Instructions (Signed)
Please read and follow all provided instructions.  Your diagnoses today include:  1. Acute streptococcal pharyngitis     Tests performed today include:  Strep test: was POSITIVE for strep throat  Flu and covid tests were negative  Vital signs. See below for your results today.   Medications prescribed:  You were given a one-time shot of penicillin to treat your strep throat.   Take any medications prescribed only as directed.   Home care instructions:  Please read the educational materials provided and follow any instructions contained in this packet.  Follow-up instructions: Please follow-up with your primary care provider as needed for further evaluation of your symptoms.  Return instructions:   Please return to the Emergency Department if you experience worsening symptoms.   Return if you have worsening problems swallowing, your neck becomes swollen, you cannot swallow your saliva or your voice becomes muffled.   Return with high persistent fever, persistent vomiting, or if you have trouble breathing.   Please return if you have any other emergent concerns.  Additional Information:  Your vital signs today were: BP 127/66   Pulse 77   Temp 98.7 F (37.1 C) (Oral)   Resp (!) 22   SpO2 95%  If your blood pressure (BP) was elevated above 135/85 this visit, please have this repeated by your doctor within one month. --------------

## 2020-03-29 ENCOUNTER — Ambulatory Visit: Payer: Managed Care, Other (non HMO) | Admitting: Family Medicine

## 2020-04-13 ENCOUNTER — Encounter: Payer: Self-pay | Admitting: Family Medicine

## 2020-04-13 DIAGNOSIS — Z13220 Encounter for screening for lipoid disorders: Secondary | ICD-10-CM

## 2020-04-13 DIAGNOSIS — R7303 Prediabetes: Secondary | ICD-10-CM

## 2020-04-13 DIAGNOSIS — I10 Essential (primary) hypertension: Secondary | ICD-10-CM

## 2020-05-16 ENCOUNTER — Encounter: Payer: Self-pay | Admitting: Family Medicine

## 2020-05-16 ENCOUNTER — Ambulatory Visit (INDEPENDENT_AMBULATORY_CARE_PROVIDER_SITE_OTHER): Payer: Managed Care, Other (non HMO) | Admitting: Family Medicine

## 2020-05-16 ENCOUNTER — Other Ambulatory Visit: Payer: Self-pay

## 2020-05-16 VITALS — BP 144/80 | HR 71 | Temp 97.8°F | Ht 72.0 in | Wt 257.5 lb

## 2020-05-16 DIAGNOSIS — R7303 Prediabetes: Secondary | ICD-10-CM | POA: Diagnosis not present

## 2020-05-16 DIAGNOSIS — Z2821 Immunization not carried out because of patient refusal: Secondary | ICD-10-CM | POA: Diagnosis not present

## 2020-05-16 DIAGNOSIS — Z1211 Encounter for screening for malignant neoplasm of colon: Secondary | ICD-10-CM

## 2020-05-16 DIAGNOSIS — R7301 Impaired fasting glucose: Secondary | ICD-10-CM | POA: Insufficient documentation

## 2020-05-16 DIAGNOSIS — Z86010 Personal history of colonic polyps: Secondary | ICD-10-CM

## 2020-05-16 DIAGNOSIS — Z13220 Encounter for screening for lipoid disorders: Secondary | ICD-10-CM

## 2020-05-16 DIAGNOSIS — I10 Essential (primary) hypertension: Secondary | ICD-10-CM

## 2020-05-16 LAB — LDL CHOLESTEROL, DIRECT: Direct LDL: 112 mg/dL

## 2020-05-16 LAB — COMPREHENSIVE METABOLIC PANEL
ALT: 20 U/L (ref 0–53)
AST: 13 U/L (ref 0–37)
Albumin: 4.4 g/dL (ref 3.5–5.2)
Alkaline Phosphatase: 64 U/L (ref 39–117)
BUN: 20 mg/dL (ref 6–23)
CO2: 25 mEq/L (ref 19–32)
Calcium: 9.3 mg/dL (ref 8.4–10.5)
Chloride: 106 mEq/L (ref 96–112)
Creatinine, Ser: 1.04 mg/dL (ref 0.40–1.50)
GFR: 80.94 mL/min (ref >60.00)
Glucose, Bld: 90 mg/dL (ref 70–99)
Potassium: 3.9 mEq/L (ref 3.5–5.1)
Sodium: 139 mEq/L (ref 135–145)
Total Bilirubin: 1.2 mg/dL (ref 0.2–1.2)
Total Protein: 6.3 g/dL (ref 6.0–8.3)

## 2020-05-16 LAB — LIPID PANEL
Cholesterol: 174 mg/dL (ref 0–200)
HDL: 46.2 mg/dL (ref >39.00)
NonHDL: 127.85
Total CHOL/HDL Ratio: 4
Triglycerides: 300 mg/dL — ABNORMAL HIGH (ref 0.0–149.0)
VLDL: 60 mg/dL — ABNORMAL HIGH (ref 0.0–40.0)

## 2020-05-16 LAB — HEMOGLOBIN A1C: Hgb A1c MFr Bld: 5.9 % (ref 4.6–6.5)

## 2020-05-16 MED ORDER — AMLODIPINE BESYLATE 10 MG PO TABS
10.0000 mg | ORAL_TABLET | Freq: Every day | ORAL | 3 refills | Status: AC
Start: 2020-05-16 — End: ?

## 2020-05-16 NOTE — Progress Notes (Signed)
Marcus Armstrong is a 55 y.o. male  Chief Complaint  Patient presents with  . Follow-up    F/u and get labs. Declines flu shot today.     HPI: Marcus Armstrong is a 55 y.o. male who is seen today for HTN, IFG f/u. He is overdue for labs. Pt had bacon, egg, sausage this AM.  For HTN, pt is Rx'd norvasc 10mg  daily and hyzaar 100-25mg  daily but he is not taking either. Pt is not on medication for blood sugar/IFG. Pt joined [employer] and is going 2x/wk.  Pt needs new referral for colonoscopy. He has a h/o polyps and last colo > 5 years ago.  He declines the flu vaccine today.   Lab Results  Component Value Date   HGBA1C 5.9 01/05/2019   No results found for: CHOL, HDL, LDLCALC, LDLDIRECT, TRIG, CHOLHDL  Lab Results  Component Value Date   NA 139 03/09/2019   K 3.9 03/09/2019   CREATININE 1.07 03/09/2019   GFRNONAA >60 03/09/2019   GFRAA >60 03/09/2019   GLUCOSE 100 (H) 03/09/2019    Past Medical History:  Diagnosis Date  . GERD (gastroesophageal reflux disease)   . History of kidney stones 2016  . Hypertension   . Neurofibromatosis ([place])   . Sleep apnea     Past Surgical History:  Procedure Laterality Date  . CARDIAC CATHETERIZATION  02/27/2012   [facility]): Normal coronaries aside from mLAD myocardial bridging verus coronary spasm, LV > 65%  . COLON SURGERY     colonoscpy with polyp resection  . polyp removal      Social History   Socioeconomic History  . Marital status: Divorced    Spouse name: Not on file  . Number of children: Not on file  . Years of education: Not on file  . Highest education level: Not on file  Occupational History  . Not on file  Tobacco Use  . Smoking status: Former Smoker    Packs/day: 0.25    Years: 30.00    Pack years: 7.50    Types: Cigarettes    Quit date: 06/06/2018    Years since quitting: 1.9  . Smokeless tobacco: Never Used  Vaping Use  . Vaping Use: Never used  Substance and Sexual Activity  . Alcohol use:  Yes    Comment: occasional beer  . 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 Sexual activity: Not on file  Other Topics Concern  . Not on file  Social History Narrative   Right handed    Social Determinants of Health   Financial Resource Strain: Not on file  Food Insecurity: Not on file  Transportation Needs: Not on file  Physical Activity: Not on file  Stress: Not on file  Social Connections: Not on file  Intimate Partner Violence: Not on file    Family History  Problem Relation Age of Onset  . Cancer Father 56       prostate     Immunization History  Administered Date(s) Administered  . PFIZER(Purple Top)SARS-COV-2 Vaccination 08/29/2019, 09/19/2019  . Tdap 06/01/2019    Outpatient Encounter Medications as of 05/16/2020  Medication Sig  . amLODipine (NORVASC) 10 MG tablet Take 1 tablet (10 mg total) by mouth daily.  . Cannabidiol POWD Apply 1 application topically 3 (three) times daily as needed (pain.). CBD Pain Relief Roll-O  . cetirizine (ZYRTEC) 10 MG tablet Take  10 mg by mouth every other day.   . diazepam (VALIUM) 5 MG tablet Take 1-2 tablets (5-10 mg total) by mouth every 6 (six) hours as needed for muscle spasms.  . Diclofenac Sodium (PENNSAID) 2 % SOLN Place 1 application onto the skin 2 (two) times daily. (Patient taking differently: Place 1 application onto the skin 2 (two) times daily as needed (pain.).)  . fluticasone (FLONASE) 50 MCG/ACT nasal spray Place 2 sprays into both nostrils daily as needed (allergies.).  Marland Kitchen gabapentin (NEURONTIN) 300 MG capsule TAKE ONE CAPSULE BY MOUTH THREE TIMES DAILY  . ibuprofen (ADVIL) 800 MG tablet Take 1 tablet (800 mg total) by mouth every 8 (eight) hours as needed. (Patient taking differently: Take 800 mg by mouth every 8 (eight) hours as needed (pain).)  . losartan-hydrochlorothiazide (HYZAAR) 100-25 MG tablet Take 1 tablet by mouth daily.  .  montelukast (SINGULAIR) 10 MG tablet Take 1 tablet (10 mg total) by mouth at bedtime.  Marland Kitchen omeprazole (PRILOSEC) 20 MG capsule Take 1 capsule (20 mg total) by mouth 2 (two) times daily before a meal. (Patient taking differently: Take 20 mg by mouth 2 (two) times daily as needed (acid reflux/indigestion.).)  . sildenafil (VIAGRA) 50 MG tablet Take 1 tablet (50 mg total) by mouth daily as needed for erectile dysfunction.  Marland Kitchen ketoconazole (NIZORAL) 2 % shampoo Apply 1 application topically 2 (two) times a week.  . nabumetone (RELAFEN) 500 MG tablet Take 1 tablet (500 mg total) by mouth 2 (two) times daily. (Patient not taking: Reported on 05/16/2020)  . tamsulosin (FLOMAX) 0.4 MG CAPS capsule Take 1 capsule (0.4 mg total) by mouth daily.   No facility-administered encounter medications on file as of 05/16/2020.     ROS: Pertinent positives and negatives noted in HPI. Remainder of ROS non-contributory    Allergies  Allergen Reactions  . Cat Hair Extract Hives, Shortness Of Breath, Swelling, Rash and Other (See Comments)  . Shellfish Allergy Shortness Of Breath, Itching and Swelling    Eye ball swelling (when not eating fresh seafood, breathing problems)    BP (!) 144/80   Pulse 71   Temp 97.8 F (36.6 C) (Temporal)   Ht 6' (1.829 m)   Wt 257 lb 8 oz (116.8 kg)   SpO2 94%   BMI 34.92 kg/m    Wt Readings from Last 3 Encounters:  05/16/20 257 lb 8 oz (116.8 kg)  01/04/20 250 lb (113.4 kg)  10/28/19 (!) 265 lb 9.6 oz (120.5 kg)   Temp Readings from Last 3 Encounters:  05/16/20 97.8 F (36.6 C) (Temporal)  03/20/20 98.7 F (37.1 C) (Oral)  01/04/20 97.8 F (36.6 C)   BP Readings from Last 3 Encounters:  05/16/20 (!) 144/80  03/20/20 127/66  10/28/19 (!) 138/82   Pulse Readings from Last 3 Encounters:  05/16/20 71  03/20/20 77  10/28/19 76     Physical Exam Constitutional:      General: He is not in acute distress.    Appearance: Normal appearance.  Cardiovascular:      Rate and Rhythm: Normal rate and regular rhythm.     Pulses: Normal pulses.  Pulmonary:     Effort: Pulmonary effort is normal. No respiratory distress.     Breath sounds: Normal breath sounds.  Neurological:     Mental Status: He is alert and oriented to person, place, and time.  Psychiatric:        Mood and Affect: Mood normal.  Behavior: Behavior normal.      A/P:   1. Essential hypertension - not controlled, pt has not been taking either BP med - ok to not restart Hyzaar Refill: - amLODipine (NORVASC) 10 MG tablet; Take 1 tablet (10 mg total) by mouth daily.  Dispense: 90 tablet; Refill: 3 - Comprehensive metabolic panel - check BP at home. Call or send MyChart message for BP average > 140 / > 90 - cont with regular exercise and limit sodium intake  2. Influenza vaccination declined by patient  3. Personal history of colonic polyps 4. Screening for colon cancer - Ambulatory referral to Gastroenterology  5. Prediabetes - not on any medication; last A1C = 5.9 in 12/2018 - Hemoglobin A1c - pt is exercising 2 days per week and just started Atkins diet with ultimate goal of losing 20lbs  6. Screening for lipid disorders - Lipid panel   This visit occurred during the SARS-CoV-2 public health emergency.  Safety protocols were in place, including screening questions prior to the visit, additional usage of staff PPE, and extensive cleaning of exam room while observing appropriate contact time as indicated for disinfecting solutions.

## 2020-05-16 NOTE — Patient Instructions (Addendum)
Stop losartan-hydrochlorothiazide Restart amlodpiine 10mg  daily Check BP - goal is under 140 (120-130) / under 90 (70-80)  Lemons (and limes) Raspberries. Strawberries. Blackberries. Kiwis. Grapefruit. Avocado. Watermelon.  GI - [phone]

## 2020-06-10 ENCOUNTER — Encounter: Payer: Self-pay | Admitting: Family Medicine

## 2020-06-10 DIAGNOSIS — R42 Dizziness and giddiness: Secondary | ICD-10-CM

## 2020-06-10 DIAGNOSIS — H9313 Tinnitus, bilateral: Secondary | ICD-10-CM

## 2020-06-10 DIAGNOSIS — R519 Headache, unspecified: Secondary | ICD-10-CM

## 2020-06-12 NOTE — Telephone Encounter (Signed)
Please see message and advise.  Thank you. ° °

## 2020-06-21 NOTE — Addendum Note (Signed)
Addended by: Ronnald Nian on: 06/21/2020 12:24 PM   Modules accepted: Orders

## 2020-07-11 NOTE — Addendum Note (Signed)
Addended by: Ronnald Nian on: 07/11/2020 05:18 PM   Modules accepted: Orders

## 2020-07-19 ENCOUNTER — Other Ambulatory Visit: Payer: Self-pay

## 2020-07-19 ENCOUNTER — Ambulatory Visit: Payer: Managed Care, Other (non HMO) | Attending: Family Medicine | Admitting: Audiology

## 2020-07-19 DIAGNOSIS — H9313 Tinnitus, bilateral: Secondary | ICD-10-CM

## 2020-07-19 DIAGNOSIS — H938X3 Other specified disorders of ear, bilateral: Secondary | ICD-10-CM

## 2020-07-19 NOTE — Procedures (Signed)
  Outpatient Audiology and [place], [place]  [address] [phone]  AUDIOLOGICAL  EVALUATION  NAME: Marcus Armstrong     DOB:   October 01, 1965      MRN: [id number]                                                                                     DATE: 07/19/2020     REFERENT: Ronnald Nian, DO STATUS: Outpatient DIAGNOSIS: Tinnitus  History: Hosey was seen for an audiological evaluation due to tinnitus occurring for several years. Janai reports his tinnitus is centralized, intermittent, causes lightheadedness, and reports it sounds like "glass crashing." He reports right aural fullness. He denies otalgia, vertigo, and hearing concerns.   Evaluation:   Otoscopy showed a clear view of the tympanic membranes, bilaterally  Tympanometry results were consistent with normal middle ear pressure and normal tympanic membrane mobility, bilaterally.   Audiometric testing was completed using Conventional Audiometry techniques with insert earphones and TDH headphones. Test results are consistent in the right ear with normal hearing sensitivity [phone] Hz and in the left ear with normal hearing sensitivity at [phone] Hz with the exception of a mild hearing loss at 4000 Hz. Speech Recognition Thresholds were obtained at 25 dB HL in the right ear and at 25 dB HL in the left ear. Word Recognition Testing was completed at 70 dB HL and Addis scored 100%, bilaterally.    Results:  Test results are consistent in the right ear with normal hearing sensitivity [phone] Hz and in the left ear with normal hearing sensitivity at [phone] Hz with the exception of a mild hearing loss at 4000 Hz. Audric was counseled regarding his tinnitus and tinnitus management strategies. Upon further discussion of Har[place]'s tinnitus, it is recommended for Desmen to be referred to an Ear, Nose, and Throat Physician to further assess tinnitus. The test results and recommendations were reviewed  with Trashawn.   Recommendations: 1. Referral to an Ear, Nose, Throat Physician for tinnitus.  2. Monitor Hearing Sensitivity, bilaterally.       Bari Mantis Audiologist, Au.D., CCC-A 07/19/2020  3:34 PM  Cc: Ronnald Nian, DO

## 2020-07-26 ENCOUNTER — Ambulatory Visit: Payer: Managed Care, Other (non HMO) | Admitting: Audiology

## 2020-07-30 ENCOUNTER — Other Ambulatory Visit: Payer: Managed Care, Other (non HMO)

## 2020-07-31 ENCOUNTER — Encounter: Payer: Self-pay | Admitting: Physician Assistant

## 2020-08-16 ENCOUNTER — Ambulatory Visit: Payer: Managed Care, Other (non HMO) | Admitting: Physician Assistant

## 2020-08-25 ENCOUNTER — Encounter: Payer: Self-pay | Admitting: Family Medicine

## 2020-09-10 ENCOUNTER — Other Ambulatory Visit: Payer: Self-pay | Admitting: Family Medicine

## 2020-09-10 ENCOUNTER — Ambulatory Visit (INDEPENDENT_AMBULATORY_CARE_PROVIDER_SITE_OTHER): Payer: Managed Care, Other (non HMO) | Admitting: Nurse Practitioner

## 2020-09-10 ENCOUNTER — Encounter: Payer: Self-pay | Admitting: Nurse Practitioner

## 2020-09-10 VITALS — BP 130/80 | HR 79 | Ht 72.0 in | Wt 254.0 lb

## 2020-09-10 DIAGNOSIS — K625 Hemorrhage of anus and rectum: Secondary | ICD-10-CM | POA: Diagnosis not present

## 2020-09-10 DIAGNOSIS — I10 Essential (primary) hypertension: Secondary | ICD-10-CM

## 2020-09-10 DIAGNOSIS — Z86010 Personal history of colonic polyps: Secondary | ICD-10-CM

## 2020-09-10 NOTE — Progress Notes (Signed)
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  He was referred to our office by Dr. [name] to schedule colonoscopy due to a prior history of colon polyps.  He reported completing diagnostic colonoscopy around the age of 66 secondary to rectal bleeding which he stated was normal, no polyps.  He underwent a screening colonoscopy around the age of 53 in 2012, he stated a grape size polyp was removed from the colon and he developed a post polypectomy bleed which required hospital admission.  He stated a clip was placed to the polypectomy site, no surgical intervention was required.  He underwent a third colonoscopy in 2015 and he reported a "grape seed" sized polyp was removed from the colon and he again developed post polypectomy bleed and he was admitted to the hospital, he was observed without repeat endoscopic procedure was necessary and he was discharged home the next day.  He denied taking any aspirin or blood thinning medications during the time these 2 colonoscopies were completed.  No history of a bleeding disorder.  He stated his second and third colonoscopies were done by the same gastroenterologist at [facility] in [place].  He switched gastroenterologist and underwent a fourth colonoscopy in 2018 by Dr. Darrick Penna in [facility] which he stated was normal, no polyps.  He was advised to repeat a colonoscopy in 3 years due to his prior history of colon polyps.  No known family history of colon cancer.  He reports passing a normal formed bowel movement daily.  He sees a small dot of red blood on the toilet tissue following most bowel movements which she attributes to  having external hemorrhoids.  No significant rectal bleeding.  He has a history of GERD, he underwent EGD at the time of his colonoscopy in 2012 or 2013 which she reported was normal.  No history of ulcers.  He takes Omeprazole or Prevacid OTC once or twice monthly as needed.  No dysphagia.  No upper or lower abdominal pain.  He takes Ibuprofen 600 to 800 mg once daily for back pain and joint pains.  No other complaints at this time.     CBC Latest Ref Rng & Units 03/09/2019  WBC 4.0 - 10.5 K/uL 5.4  Hemoglobin 13.0 - 17.0 g/dL 17.8(H)  Hematocrit 39.0 - 52.0 % 52.6(H)  Platelets 150 - 400 K/uL 245   CMP Latest Ref Rng & Units 05/16/2020 03/09/2019 01/05/2019  Glucose 70 - 99 mg/dL 90 100(H) 97  BUN 6 - 23 mg/dL 20 11 13   Creatinine 0.40 - 1.50 mg/dL 1.04 1.07 0.94  Sodium 135 - 145 mEq/L 139 139 137  Potassium 3.5 - 5.1 mEq/L 3.9 3.9 3.8  Chloride 96 - 112 mEq/L 106 103 106  CO2 19 - 32 mEq/L 25 26 22   Calcium 8.4 - 10.5 mg/dL 9.3 9.7 9.6  Total Protein 6.0 - 8.3 g/dL 6.3 6.9 6.7  Total Bilirubin 0.2 - 1.2 mg/dL 1.2 1.7(H) 1.0  Alkaline Phos 39 - 117 U/L 64 60 66  AST 0 - 37 U/L 13 21 18   ALT 0 - 53 U/L 20 26 19     Past Medical History:  Diagnosis Date  . GERD (gastroesophageal  reflux disease)   . History of kidney stones 2016  . Hypertension   . Neurofibromatosis ([place])   . Sleep apnea    Past Surgical History:  Procedure Laterality Date  . 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 COLON SURGERY     colonoscpy with polyp resection  . polyp removal     Social History:   He is divorced.  He has 2 sons and 1 daughter.  He is a Animal nutritionist.  Patient reports that he quit smoking about 2 years ago. His smoking use included cigarettes. He has a 7.50 pack-year smoking history. He has never used smokeless tobacco.  He denies alcohol or drug use.  Past epic records document prior  cocaine use.  Family History: No family history of esophageal, gastric or colon cancer   Allergies  Allergen Reactions  . Cat Hair Extract Hives, Shortness Of Breath, Swelling, Rash and Other (See Comments)  . Shellfish Allergy Shortness Of Breath, Itching and Swelling    Eye ball swelling (when not eating fresh seafood, breathing problems)      Outpatient Encounter Medications as of 09/10/2020  Medication Sig  . amLODipine (NORVASC) 10 MG tablet Take 1 tablet (10 mg total) by mouth daily.  . Cannabidiol POWD Apply 1 application topically 3 (three) times daily as needed (pain.). CBD Pain Relief Roll-O  . cetirizine (ZYRTEC) 10 MG tablet Take 10 mg by mouth every other day.   . diazepam (VALIUM) 5 MG tablet Take 1-2 tablets (5-10 mg total) by mouth every 6 (six) hours as needed for muscle spasms.  . Diclofenac Sodium (PENNSAID) 2 % SOLN Place 1 application onto the skin 2 (two) times daily. (Patient taking differently: Place 1 application onto the skin 2 (two) times daily as needed (pain.).)  . fluticasone (FLONASE) 50 MCG/ACT nasal spray Place 2 sprays into both nostrils daily as needed (allergies.).  Marland Kitchen gabapentin (NEURONTIN) 300 MG capsule TAKE ONE CAPSULE BY MOUTH THREE TIMES DAILY  . ibuprofen (ADVIL) 800 MG tablet Take 1 tablet (800 mg total) by mouth every 8 (eight) hours as needed. (Patient taking differently: Take 800 mg by mouth every 8 (eight) hours as needed (pain).)  . montelukast (SINGULAIR) 10 MG tablet Take 1 tablet (10 mg total) by mouth at bedtime.  Marland Kitchen omeprazole (PRILOSEC) 20 MG capsule Take 1 capsule (20 mg total) by mouth 2 (two) times daily before a meal. (Patient taking differently: Take 20 mg by mouth 2 (two) times daily as needed (acid reflux/indigestion.).)  . sildenafil (VIAGRA) 50 MG tablet Take 1 tablet (50 mg total) by mouth daily as needed for erectile dysfunction.   No facility-administered encounter medications on file as of 09/10/2020.    REVIEW OF  SYSTEMS: Gen: Denies fever, sweats or chills. No weight loss.  CV: Denies chest pain, palpitations or edema. Resp: Denies cough, shortness of breath of hemoptysis.  GI: Denies heartburn, dysphagia, stomach or lower abdominal pain. No diarrhea or constipation.  GU : Denies urinary burning, blood in urine, increased urinary frequency or incontinence. MS: + Back pain, joint pains.  Derm: Denies rash, itchiness, skin lesions or unhealing ulcers. Psych: + Anxiety and depression. Heme: Denies bruising, bleeding. Neuro:  Denies headaches, dizziness or paresthesias. Endo:  Denies any problems with DM, thyroid or adrenal function.   PHYSICAL EXAM: BP 130/80   Pulse 79   Ht 6' (1.829 m)   [employer]  254 lb (115.2 kg)   BMI 34.45 kg/m   General: 55 year old male in no acute distress. Head: Normocephalic and atraumatic. Eyes:  Sclerae non-icteric, conjunctive pink. Ears: Normal auditory acuity. Mouth: Dentition intact. No ulcers or lesions.  Neck: Supple, no lymphadenopathy or thyromegaly.  Lungs: Clear bilaterally to auscultation without wheezes, crackles or rhonchi. Heart: Regular rate and rhythm. No murmur, rub or gallop appreciated.  Abdomen: Soft, nontender, non distended. No masses. No hepatosplenomegaly. Normoactive bowel sounds x 4 quadrants.  Rectal: Deferred. Musculoskeletal: Symmetrical with no gross deformities. Skin: Warm and dry. No rash or lesions on visible extremities. Extremities: No edema. Neurological: Alert oriented x 4, no focal deficits.  Psychological:  Alert and cooperative. Normal mood and affect.  ASSESSMENT AND PLAN:  6.  55 year old male with a history of colon polyps s/p polypectomy bleed which required hospital admission in 2012 and 2015. His most recent colonoscopy in 2018 was reported as normal.  -Request copy of colonoscopy procedure and biopsy results 2012 in 2016 at [facility] -Colonoscopy benefits and risks discussed including  risk with sedation, risk of bleeding, perforation and infection  -No NSAIDS for 3 to 4 days prior to colonoscopy   2. Rectal bleeding, history of external hemorrhoids -See plan in # 1 -Patient preferred not to have CBC drawn today  3. GERD symptoms, infrequent, relieved by PPI OTC once or twice monthly  -GERD handout -Patient to follow-up in our office if his reflux symptoms increase        CC:  Cirigliano, Garvin Fila, DO

## 2020-09-10 NOTE — Patient Instructions (Signed)
If you are age 56 or younger, your body mass index should be between 19-25. Your Body mass index is 34.45 kg/m. If this is out of the aformentioned range listed, please consider follow up with your Primary Care Provider.   PROCEDURES: You have been scheduled for a colonoscopy. Please follow the written instructions given to you at your visit today. If you use inhalers (even only as needed), please bring them with you on the day of your procedure.  RECOMMENDATIONS: Desitin: Apply a small amount to the external anal area three times a day as needed.  It was great seeing you today! Thank you for entrusting me with your care and choosing [facility].  Noralyn Pick, CRNP   Gastroesophageal Reflux Disease, Adult  Gastroesophageal reflux (GER) happens when acid from the stomach flows up into the tube that connects the mouth and the stomach (esophagus). Normally, food travels down the esophagus and stays in the stomach to be digested. With GER, food and stomach acid sometimes move back up into the esophagus. You may have a disease called gastroesophageal reflux disease (GERD) if the reflux:  Happens often.  Causes frequent or very bad symptoms.  Causes problems such as damage to the esophagus. When this happens, the esophagus becomes sore and swollen. Over time, GERD can make small holes (ulcers) in the lining of the esophagus. What are the causes? This condition is caused by a problem with the muscle between the esophagus and the stomach. When this muscle is weak or not normal, it does not close properly to keep food and acid from coming back up from the stomach. The muscle can be weak because of:  Tobacco use.  Pregnancy.  Having a certain type of hernia (hiatal hernia).  Alcohol use.  Certain foods and drinks, such as coffee, chocolate, onions, and peppermint. What increases the risk?  Being overweight.  Having a disease that affects your connective  tissue.  Taking NSAIDs, such a ibuprofen. What are the signs or symptoms?  Heartburn.  Difficult or painful swallowing.  The feeling of having a lump in the throat.  A bitter taste in the mouth.  Bad breath.  Having a lot of saliva.  Having an upset or bloated stomach.  Burping.  Chest pain. Different conditions can cause chest pain. Make sure you see your doctor if you have chest pain.  Shortness of breath or wheezing.  A long-term cough or a cough at night.  Wearing away of the surface of teeth (tooth enamel).  Weight loss. How is this treated?  Making changes to your diet.  Taking medicine.  Having surgery. Treatment will depend on how bad your symptoms are. Follow these instructions at home: Eating and drinking  Follow a diet as told by your doctor. You may need to avoid foods and drinks such as: ? Coffee and tea, with or without caffeine. ? Drinks that contain alcohol. ? Energy drinks and sports drinks. ? Bubbly (carbonated) drinks or sodas. ? Chocolate and cocoa. ? Peppermint and mint flavorings. ? Garlic and onions. ? Horseradish. ? Spicy and acidic foods. These include peppers, chili powder, curry powder, vinegar, hot sauces, and BBQ sauce. ? Citrus fruit juices and citrus fruits, such as oranges, lemons, and limes. ? Tomato-based foods. These include red sauce, chili, salsa, and pizza with red sauce. ? Fried and fatty foods. These include donuts, french fries, potato chips, and high-fat dressings. ? High-fat meats. These include hot dogs, rib eye steak, sausage, ham, and  bacon. ? High-fat dairy items, such as whole milk, butter, and cream cheese.  Eat small meals often. Avoid eating large meals.  Avoid drinking large amounts of liquid with your meals.  Avoid eating meals during the 2-3 hours before bedtime.  Avoid lying down right after you eat.  Do not exercise right after you eat.   Lifestyle  Do not smoke or use any products that contain  nicotine or tobacco. If you need help quitting, ask your doctor.  Try to lower your stress. If you need help doing this, ask your doctor.  If you are overweight, lose an amount of weight that is healthy for you. Ask your doctor about a safe weight loss goal.   General instructions  Pay attention to any changes in your symptoms.  Take over-the-counter and prescription medicines only as told by your doctor.  Do not take aspirin, ibuprofen, or other NSAIDs unless your doctor says it is okay.  Wear loose clothes. Do not wear anything tight around your waist.  Raise (elevate) the head of your bed about 6 inches (15 cm). You may need to use a wedge to do this.  Avoid bending over if this makes your symptoms worse.  Keep all follow-up visits. Contact a doctor if:  You have new symptoms.  You lose weight and you do not know why.  You have trouble swallowing or it hurts to swallow.  You have wheezing or a cough that keeps happening.  You have a hoarse voice.  Your symptoms do not get better with treatment. Get help right away if:  You have sudden pain in your arms, neck, jaw, teeth, or back.  You suddenly feel sweaty, dizzy, or light-headed.  You have chest pain or shortness of breath.  You vomit and the vomit is green, yellow, or black, or it looks like blood or coffee grounds.  You faint.  Your poop (stool) is red, bloody, or black.  You cannot swallow, drink, or eat. These symptoms may represent a serious problem that is an emergency. Do not wait to see if the symptoms will go away. Get medical help right away. Call your local emergency services (911 in the [place] Do not drive yourself to the hospital. Summary  If a person has gastroesophageal reflux disease (GERD), food and stomach acid move back up into the esophagus and cause symptoms or problems such as damage to the esophagus.  Treatment will depend on how bad your symptoms are.  Follow a diet as told by your  doctor.  Take all medicines only as told by your doctor. This information is not intended to replace advice given to you by your health care provider. Make sure you discuss any questions you have with your health care provider. Document Revised: 10/03/2019 Document Reviewed: 10/03/2019 Elsevier Patient Education  [place].

## 2020-09-17 NOTE — Progress Notes (Signed)
Agree with the assessment and plan as outlined by Colleen Kennedy-Smith, NP.   Anique Beckley, DO, FACG [facility]   

## 2020-09-18 ENCOUNTER — Encounter: Payer: Managed Care, Other (non HMO) | Admitting: Internal Medicine

## 2020-09-19 ENCOUNTER — Other Ambulatory Visit: Payer: Self-pay

## 2020-09-20 ENCOUNTER — Encounter: Payer: Self-pay | Admitting: Family Medicine

## 2020-09-20 ENCOUNTER — Ambulatory Visit (INDEPENDENT_AMBULATORY_CARE_PROVIDER_SITE_OTHER): Payer: Managed Care, Other (non HMO) | Admitting: Family Medicine

## 2020-09-20 VITALS — BP 130/78 | HR 73 | Temp 97.7°F | Ht 72.0 in | Wt 255.4 lb

## 2020-09-20 DIAGNOSIS — H9313 Tinnitus, bilateral: Secondary | ICD-10-CM | POA: Diagnosis not present

## 2020-09-20 DIAGNOSIS — F419 Anxiety disorder, unspecified: Secondary | ICD-10-CM

## 2020-09-20 DIAGNOSIS — M25561 Pain in right knee: Secondary | ICD-10-CM

## 2020-09-20 DIAGNOSIS — G8929 Other chronic pain: Secondary | ICD-10-CM

## 2020-09-20 DIAGNOSIS — B359 Dermatophytosis, unspecified: Secondary | ICD-10-CM | POA: Diagnosis not present

## 2020-09-20 DIAGNOSIS — M25562 Pain in left knee: Secondary | ICD-10-CM

## 2020-09-20 DIAGNOSIS — H66001 Acute suppurative otitis media without spontaneous rupture of ear drum, right ear: Secondary | ICD-10-CM | POA: Diagnosis not present

## 2020-09-20 MED ORDER — DIAZEPAM 5 MG PO TABS: 5.0000 mg | ORAL_TABLET | Freq: Four times a day (QID) | ORAL | 0 refills | Status: DC | PRN

## 2020-09-20 MED ORDER — AMOXICILLIN-POT CLAVULANATE 875-125 MG PO TABS
1.0000 | ORAL_TABLET | Freq: Two times a day (BID) | ORAL | 0 refills | Status: DC
Start: 2020-09-20 — End: 2021-05-29

## 2020-09-20 MED ORDER — CLOTRIMAZOLE 1 % EX CREA: 1.0000 "application " | TOPICAL_CREAM | Freq: Two times a day (BID) | CUTANEOUS | 1 refills | Status: DC

## 2020-09-20 NOTE — Patient Instructions (Signed)
[facility]: [email or web address]  [facility] [email or web address]  [email or web address]  [facility] [email or web address]  [email or web address]  

## 2020-09-20 NOTE — Progress Notes (Signed)
Marcus Armstrong is a 55 y.o. male  Chief Complaint  Patient presents with   Follow-up    Pt would like to follow up regarding swimmers ear and potential ringworm on left temple.     HPI: Marcus Armstrong is a 55 y.o. male patient seen today for multiple concerns: F/u from UC visit on 5/22 where pt was dx with Rt otitis externa and was Rx'd cortisporin otic gtts x 10 days. Symptoms have improved but not resolved. He states his Rt ear still feels clogged and pops at times. No pain. No drainage. No fevers.  2. Spot on Lt temple, can be itchy at time. Symptoms x 3 wks. Not painful or sore. He tried using triamcinolone cream for the past few days.   3. Pt requests referral/recommendation for [facility] counseling. Getting engaged.   4. Chronic knee pain, stiffness. Getting worse. Lt > Rt. Recently having Lt leg pain from hip to foot. He works and stands 11+hrs 3 days per week. Pain is waking him up at night. Knee is swollen. Pt taking ibuprofen which keeps swelling at minimal. Does not lock but does "catch and pop". Had cortisone injections in the past - helpful for a bit.   5. ENT referral for B/L tinnitus - saw audiology and referral was recommended  Past Medical History:  Diagnosis Date   GERD (gastroesophageal reflux disease)    History of kidney stones 2016   Hypertension    Neurofibromatosis ([place])    Sleep apnea    Swimmer's ear of right side     Past Surgical History:  Procedure Laterality Date   CARDIAC CATHETERIZATION  02/27/2012   [facility]): Normal coronaries aside from mLAD myocardial bridging verus coronary spasm, LV > 65%   COLON SURGERY     colonoscpy with polyp resection   polyp removal      Social History   Socioeconomic History   Marital status: Divorced    Spouse name: Not on file   Number of children: Not on file   Years of education: Not on file   Highest education level: Not on file  Occupational History   Not on file  Tobacco Use   Smoking status:  Former    Packs/day: 0.25    Years: 30.00    Pack years: 7.50    Types: Cigarettes    Quit date: 06/06/2018    Years since quitting: 2.2   Smokeless tobacco: Never  Vaping Use   Vaping Use: Never used  Substance and Sexual Activity   Alcohol use: Yes    Comment: occasional beer   Drug use: Not Currently    Types: "Crack" cocaine    Comment: sober x 67days, 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   Sexual activity: Not on file  Other Topics Concern   Not on file  Social History Narrative   Right handed    Social Determinants of Health   Financial Resource Strain: Not on file  Food Insecurity: Not on file  Transportation Needs: Not on file  Physical Activity: Not on file  Stress: Not on file  Social Connections: Not on file  Intimate Partner Violence: Not on file    Family History  Problem Relation Age of Onset   Cancer Father 14       prostate   Colon cancer Neg Hx    Stomach cancer Neg Hx    Esophageal cancer Neg Hx    Pancreatic cancer Neg Hx      Immunization History  Administered Date(s) Administered   PFIZER(Purple Top)SARS-COV-2 Vaccination 08/29/2019, 09/19/2019   Tdap 06/01/2019    Outpatient Encounter Medications as of 09/20/2020  Medication Sig   amLODipine (NORVASC) 10 MG tablet Take 1 tablet (10 mg total) by mouth daily.   Cannabidiol POWD Apply 1 application topically 3 (three) times daily as needed (pain.). CBD Pain Relief Roll-O   cetirizine (ZYRTEC) 10 MG tablet Take 10 mg by mouth every other day.    diazepam (VALIUM) 5 MG tablet Take 1-2 tablets (5-10 mg total) by mouth every 6 (six) hours as needed for muscle spasms.   Diclofenac Sodium (PENNSAID) 2 % SOLN Place 1 application onto the skin 2 (two) times daily. (Patient taking differently: Place 1 application onto the skin 2 (two) times daily as needed (pain.).)   fluticasone (FLONASE) 50 MCG/ACT nasal spray Place 2 sprays into both nostrils daily as needed (allergies.).   ibuprofen (ADVIL) 800 MG  tablet Take 1 tablet (800 mg total) by mouth every 8 (eight) hours as needed. (Patient taking differently: Take 800 mg by mouth every 8 (eight) hours as needed (pain).)   losartan-hydrochlorothiazide (HYZAAR) 100-25 MG tablet TAKE 1 TABLET BY MOUTH EVERY DAY   montelukast (SINGULAIR) 10 MG tablet Take 1 tablet (10 mg total) by mouth at bedtime.   omeprazole (PRILOSEC) 20 MG capsule Take 1 capsule (20 mg total) by mouth 2 (two) times daily before a meal. (Patient taking differently: Take 20 mg by mouth 2 (two) times daily as needed (acid reflux/indigestion.).)   sildenafil (VIAGRA) 50 MG tablet Take 1 tablet (50 mg total) by mouth daily as needed for erectile dysfunction.   gabapentin (NEURONTIN) 300 MG capsule TAKE ONE CAPSULE BY MOUTH THREE TIMES DAILY (Patient not taking: Reported on 09/20/2020)   neomycin-polymyxin-hydrocortisone (CORTISPORIN) OTIC solution SMARTSIG:In Ear(s) (Patient not taking: Reported on 09/20/2020)   No facility-administered encounter medications on file as of 09/20/2020.     ROS: Pertinent positives and negatives noted in HPI. Remainder of ROS non-contributory    Allergies  Allergen Reactions   Cat Hair Extract Hives, Shortness Of Breath, Swelling, Rash and Other (See Comments)   Shellfish Allergy Shortness Of Breath, Itching and Swelling    Eye ball swelling (when not eating fresh seafood, breathing problems)    BP 130/78 (BP Location: Left Arm, Patient Position: Sitting, Cuff Size: Large)   Pulse 73   Temp 97.7 F (36.5 C) (Temporal)   Ht 6' (1.829 m)   Wt 255 lb 6.4 oz (115.8 kg)   SpO2 97%   BMI 34.64 kg/m  Wt Readings from Last 3 Encounters:  09/20/20 255 lb 6.4 oz (115.8 kg)  09/10/20 254 lb (115.2 kg)  05/16/20 257 lb 8 oz (116.8 kg)   Temp Readings from Last 3 Encounters:  09/20/20 97.7 F (36.5 C) (Temporal)  05/16/20 97.8 F (36.6 C) (Temporal)  03/20/20 98.7 F (37.1 C) (Oral)   BP Readings from Last 3 Encounters:  09/20/20 130/78   09/10/20 130/80  05/16/20 (!) 144/80   Pulse Readings from Last 3 Encounters:  09/20/20 73  09/10/20 79  05/16/20 71     Physical Exam Constitutional:      General: He is not in acute distress.    Appearance: He is not ill-appearing.  HENT:     Right Ear: External ear normal. Tympanic membrane is erythematous and bulging.     Left Ear: Tympanic membrane, ear canal and external ear normal.  Musculoskeletal:     Right knee: No erythema.  Normal range of motion. No tenderness.     Left knee: Effusion present. No erythema. Decreased range of motion. Tenderness present.  Lymphadenopathy:     Cervical: No cervical adenopathy.  Skin:    Comments: Lt temple with circular area of dry, rough skin with slightly raised border  Neurological:     Mental Status: He is alert and oriented to person, place, and time.  Psychiatric:        Mood and Affect: Mood normal.        Behavior: Behavior normal.     A/P:  1. Bilateral chronic knee pain - ongoing x years, Lt > Rt - Lt knee now with effusion - Ambulatory referral to Orthopedic Surgery  2. Tinnitus of both ears - saw audiology in 07/2020, mild hearing loss in Lt ear - pt requesting MRI or CT and was recommended to see ENT so will place referral today - Ambulatory referral to ENT  3. Tinea - over Lt temple x 3 wks - occasional itch Rx: - clotrimazole (CLOTRIMAZOLE AF) 1 % cream; Apply 1 application topically 2 (two) times daily.  Dispense: 30 g; Refill: 1  4. Non-recurrent acute suppurative otitis media of right ear without spontaneous rupture of tympanic membrane - treated for AOE 3 wks ago but still with ear fullness, popping and exam c/w AOM Rx: - amoxicillin-clavulanate (AUGMENTIN) 875-125 MG tablet; Take 1 tablet by mouth 2 (two) times daily.  Dispense: 20 tablet; Refill: 0 - Ambulatory referral to ENT  5. Anxiety - recommendations given in AVS and discussed with pt for [facility] counselor Refill: - diazepam (VALIUM) 5 MG  tablet; Take 1-2 tablets (5-10 mg total) by mouth every 6 (six) hours as needed for muscle spasms.  Dispense: 30 tablet; Refill: 0    This visit occurred during the SARS-CoV-2 public health emergency.  Safety protocols were in place, including screening questions prior to the visit, additional usage of staff PPE, and extensive cleaning of exam room while observing appropriate contact time as indicated for disinfecting solutions.

## 2020-10-05 ENCOUNTER — Encounter: Payer: Self-pay | Admitting: Nurse Practitioner

## 2020-10-09 ENCOUNTER — Encounter: Payer: Managed Care, Other (non HMO) | Admitting: Gastroenterology

## 2020-10-16 ENCOUNTER — Encounter: Payer: Managed Care, Other (non HMO) | Admitting: Gastroenterology

## 2021-04-24 ENCOUNTER — Other Ambulatory Visit: Payer: Self-pay

## 2021-04-25 ENCOUNTER — Encounter: Payer: BC Managed Care – PPO | Admitting: Family Medicine

## 2021-04-25 ENCOUNTER — Telehealth: Payer: Self-pay | Admitting: Family Medicine

## 2021-04-25 NOTE — Telephone Encounter (Signed)
Pt was a no show for his TOC appointment.

## 2021-05-13 DIAGNOSIS — M25562 Pain in left knee: Secondary | ICD-10-CM | POA: Diagnosis not present

## 2021-05-13 DIAGNOSIS — M25561 Pain in right knee: Secondary | ICD-10-CM | POA: Diagnosis not present

## 2021-05-14 DIAGNOSIS — M25522 Pain in left elbow: Secondary | ICD-10-CM | POA: Diagnosis not present

## 2021-05-22 DIAGNOSIS — M25511 Pain in right shoulder: Secondary | ICD-10-CM | POA: Diagnosis not present

## 2021-05-25 ENCOUNTER — Ambulatory Visit: Payer: BC Managed Care – PPO

## 2021-05-29 ENCOUNTER — Ambulatory Visit: Payer: BC Managed Care – PPO | Admitting: Cardiology

## 2021-05-29 ENCOUNTER — Other Ambulatory Visit: Payer: Self-pay

## 2021-05-29 ENCOUNTER — Encounter: Payer: Self-pay | Admitting: Cardiology

## 2021-05-29 VITALS — BP 120/79 | HR 65 | Temp 97.9°F | Resp 17 | Ht 72.0 in | Wt 251.2 lb

## 2021-05-29 DIAGNOSIS — I4510 Unspecified right bundle-branch block: Secondary | ICD-10-CM

## 2021-05-29 DIAGNOSIS — R252 Cramp and spasm: Secondary | ICD-10-CM | POA: Diagnosis not present

## 2021-05-29 DIAGNOSIS — G4730 Sleep apnea, unspecified: Secondary | ICD-10-CM

## 2021-05-29 DIAGNOSIS — R002 Palpitations: Secondary | ICD-10-CM | POA: Diagnosis not present

## 2021-05-29 DIAGNOSIS — F17210 Nicotine dependence, cigarettes, uncomplicated: Secondary | ICD-10-CM | POA: Diagnosis not present

## 2021-05-29 DIAGNOSIS — I10 Essential (primary) hypertension: Secondary | ICD-10-CM

## 2021-05-29 NOTE — Progress Notes (Signed)
Date:  05/29/2021   ID:  Marcus Armstrong, DOB [birth year], MRN [id number]  PCP:  Marcus Landsman, MD  Cardiologist:  Marcus Kras, DO, [facility] (established care 05/29/21) Former Cardiology Providers: NA  REASON FOR CONSULT: Shortness of breath, fatigue, palpitations  REQUESTING PHYSICIAN:  Marcus Armstrong, [address],  [place] [id number]  Chief Complaint  Patient presents with   New Patient (Initial Visit)   Palpitations   Fatigue    HPI  Marcus Armstrong is a 56 y.o. African-American male who presents to the office with a chief complaint of "palpitations and leg cramps." Patient's past medical history and cardiovascular risk factors include: Benign essential hypertension, BPH, cigarette smoking, neurofibromatosis, sleep apnea not on CPAP, obesity due to excess calories.   He is referred to the office at the request of Marcus Landsman, MD for evaluation of shortness of breath, fatigue, palpitations.  Palpitations: Present for the last several years, occurs once or twice a month, when present very intermittent, lasting for a few seconds, no improving or worsening factors, self-limited.  At times associated with lightheaded and dizziness.  Denies near-syncope or syncopal events.  Consumes 4 to 7 ounces of alcoholic beverage on a daily basis during working days.  Denies the use of coffee/tea, stimulants, energy drinks, herbal supplements, weight loss supplements, or new over-the-counter medications.  Leg cramps: Bilateral leg cramps, for the last 4 to 5 months, intermittent, no improving worsening factors, denies claudication, no use of statin, no known thyroid disease or electrolyte abnormalities, patient claims to be well-hydrated, and smokes 1 cigarette/day.  He does over-the-counter magnesium and potassium supplements.  FUNCTIONAL STATUS: No structured exercise program or daily routine.   ALLERGIES: Allergies  Allergen Reactions   Cat Hair Extract Hives, Shortness Of Breath,  Swelling, Rash and Other (See Comments)   Shellfish Allergy Shortness Of Breath, Itching and Swelling    Eye ball swelling (when not eating fresh seafood, breathing problems)    MEDICATION LIST PRIOR TO VISIT: Current Meds  Medication Sig   amLODipine (NORVASC) 10 MG tablet Take 1 tablet (10 mg total) by mouth daily.   Cannabidiol POWD Apply 1 application topically 3 (three) times daily as needed (pain.). CBD Pain Relief Roll-O   cetirizine (ZYRTEC) 10 MG tablet Take 10 mg by mouth every other day.    cyclobenzaprine (FLEXERIL) 10 MG tablet Take 10 mg by mouth 3 (three) times daily as needed for muscle spasms.   fluticasone (FLONASE) 50 MCG/ACT nasal spray Place 2 sprays into both nostrils daily as needed (allergies.).   hydrochlorothiazide (HYDRODIURIL) 25 MG tablet Take 25 mg by mouth daily.   ibuprofen (ADVIL) 800 MG tablet Take 1 tablet (800 mg total) by mouth every 8 (eight) hours as needed. (Patient taking differently: Take 800 mg by mouth every 8 (eight) hours as needed (pain).)   losartan (COZAAR) 50 MG tablet Take 50 mg by mouth daily.   meloxicam (MOBIC) 15 MG tablet Take 15 mg by mouth daily.   montelukast (SINGULAIR) 10 MG tablet Take 1 tablet (10 mg total) by mouth at bedtime.   omeprazole (PRILOSEC) 20 MG capsule Take 1 capsule (20 mg total) by mouth 2 (two) times daily before a meal. (Patient taking differently: Take 20 mg by mouth 2 (two) times daily as needed (acid reflux/indigestion.).)   sildenafil (VIAGRA) 50 MG tablet Take 1 tablet (50 mg total) by mouth daily as needed for erectile dysfunction.   tamsulosin (FLOMAX) 0.4 MG CAPS capsule Take 0.4 mg by  mouth daily.     PAST MEDICAL HISTORY: Past Medical History:  Diagnosis Date   GERD (gastroesophageal reflux disease)    History of kidney stones 2016   Hypertension    Neurofibromatosis ([place])    Sleep apnea    Swimmer's ear of right side     PAST SURGICAL HISTORY: Past Surgical History:  Procedure Laterality  Date   CARDIAC CATHETERIZATION  02/27/2012   [facility]): Normal coronaries aside from mLAD myocardial bridging verus coronary spasm, LV > 65%   COLON SURGERY     colonoscpy with polyp resection   polyp removal     SPINAL FUSION  03/2019    FAMILY HISTORY: The patient family history includes Cancer (age of onset: 42) in his father; Diabetes in his mother and sister; Heart attack (age of onset: 31) in his brother; Heart disease in his father.  SOCIAL HISTORY:  The patient  reports that he has been smoking cigarettes. He has a 7.50 pack-year smoking history. He has never used smokeless tobacco. He reports current alcohol use. He reports that he does not currently use drugs after having used the following drugs: "Crack" cocaine.  REVIEW OF SYSTEMS: Review of Systems  Cardiovascular:  Positive for palpitations. Negative for chest pain, dyspnea on exertion, leg swelling, near-syncope, orthopnea, paroxysmal nocturnal dyspnea and syncope.  Respiratory:  Positive for shortness of breath.   Musculoskeletal:  Positive for muscle cramps (leg cramp).   PHYSICAL EXAM: Vitals with BMI 05/29/2021 09/20/2020 09/10/2020  Height 6\' 0"  6\' 0"  6\' 0"   Weight 251 lbs 3 oz 255 lbs 6 oz 254 lbs  BMI 34.06 03.47 42.59  Systolic 563 875 643  Diastolic 79 78 80  Pulse 65 73 79    CONSTITUTIONAL: Well-developed and well-nourished. No acute distress.  SKIN: Skin is warm and dry. No rash noted. No cyanosis. No pallor. No jaundice HEAD: Normocephalic and atraumatic.  EYES: No scleral icterus MOUTH/THROAT: Moist oral membranes.  NECK: No JVD present. No thyromegaly noted. No carotid bruits  LYMPHATIC: No visible cervical adenopathy.  CHEST Normal respiratory effort. No intercostal retractions  LUNGS: Clear to auscultation bilaterally.  No stridor. No wheezes. No rales.  CARDIOVASCULAR: Regular rate and rhythm, positive S1-S2, no murmurs rubs or gallops appreciated. ABDOMINAL: Obese, soft, nontender,  nondistended, positive bowel sounds all 4 quadrants. No apparent ascites.  EXTREMITIES: No peripheral edema, warm to touch, 2+ bilateral femoral, bilateral 1+ popliteal, 2+ bilateral DP and PT pulses HEMATOLOGIC: No significant bruising NEUROLOGIC: Oriented to person, place, and time. Nonfocal. Normal muscle tone.  PSYCHIATRIC: Normal mood and affect. Normal behavior. Cooperative  CARDIAC DATABASE: EKG: 05/29/2021: NSR, 61 bpm, right bundle branch block, LAE, TWI either secondary to RBBB or anterolateral ischemia, without underlying injury pattern. Compared to prior EKG 03/09/2019  TWI are not new findings but  RBBB is new.   Echocardiogram: No results found for this or any previous visit from the past 1095 days.    Stress Testing: No results found for this or any previous visit from the past 1095 days.   Heart Catheterization: None  LABORATORY DATA: CBC Latest Ref Rng & Units 03/09/2019  WBC 4.0 - 10.5 K/uL 5.4  Hemoglobin 13.0 - 17.0 g/dL 17.8(H)  Hematocrit 39.0 - 52.0 % 52.6(H)  Platelets 150 - 400 K/uL 245    CMP Latest Ref Rng & Units 05/16/2020 03/09/2019 01/05/2019  Glucose 70 - 99 mg/dL 90 100(H) 97  BUN 6 - 23 mg/dL 20 11 13   Creatinine 0.40 -  1.50 mg/dL 1.04 1.07 0.94  Sodium 135 - 145 mEq/L 139 139 137  Potassium 3.5 - 5.1 mEq/L 3.9 3.9 3.8  Chloride 96 - 112 mEq/L 106 103 106  CO2 19 - 32 mEq/L 25 26 22   Calcium 8.4 - 10.5 mg/dL 9.3 9.7 9.6  Total Protein 6.0 - 8.3 g/dL 6.3 6.9 6.7  Total Bilirubin 0.2 - 1.2 mg/dL 1.2 1.7(H) 1.0  Alkaline Phos 39 - 117 U/L 64 60 66  AST 0 - 37 U/L 13 21 18   ALT 0 - 53 U/L 20 26 19     Lipid Panel     Component Value Date/Time   CHOL 174 05/16/2020 1200   TRIG 300.0 (H) 05/16/2020 1200   HDL 46.20 05/16/2020 1200   CHOLHDL 4 05/16/2020 1200   VLDL 60.0 (H) 05/16/2020 1200   LDLDIRECT 112.0 05/16/2020 1200    No components found for: NTPROBNP No results for input(s): PROBNP in the last 8760 hours. No results for input(s):  TSH in the last 8760 hours.  BMP No results for input(s): NA, K, CL, CO2, GLUCOSE, BUN, CREATININE, CALCIUM, GFRNONAA, GFRAA in the last 8760 hours.  HEMOGLOBIN A1C Lab Results  Component Value Date   HGBA1C 5.9 05/16/2020   External Labs: Collected: 03/12/2021 provided by referring physician. TSH 0.55. Total cholesterol 175, triglycerides 227, HDL 66, LDL 73, non-HDL 109 Sodium 138, potassium 3.6, chloride 101, bicarb 24, AST 14, ALT 23, alkaline phosphatase 55. Hemoglobin 16.9.  Hematocrit 48.4%   IMPRESSION:    ICD-10-CM   1. Palpitations  R00.2     2. Leg cramp  R25.2 PCV ANKLE BRACHIAL INDEX (ABI)    3. Essential hypertension  I10 EKG 12-Lead    PCV ECHOCARDIOGRAM COMPLETE    4. RBBB  I45.10     5. Cigarette smoker  F17.210     6. Sleep apnea, unspecified type  G47.30        RECOMMENDATIONS: Colyn Miron is a 56 y.o. African-American male whose past medical history and cardiac risk factors include: Benign essential hypertension, BPH, cigarette smoking, neurofibromatosis, sleep apnea not on CPAP, obesity due to excess calories.   Palpitations No identifiable reversible cause. Patient educated on importance of reducing alcohol intake. TSH within normal limits. Electrolytes within normal limits. We discussed proceeding with a 14-day extended Holter monitor but diagnostic yield may be low due to infrequent symptoms patient would like to hold of on additional testing for now. Will reach out if they get worse in intensity, frequency, duration.  Leg cramp Concerned about peripheral artery disease. Vascular exam performed as noted above. Educated on importance of complete smoking cessation. Electrolytes within acceptable range. We will perform ABIs. Further recommendations to follow  Essential hypertension Office blood pressures are well controlled. Medications reconciled.  RBBB Appears to be new compared to December 2020. However, patient states that he was  told several years ago that " he has a normal variant of EKG." And right bundle branch block is not new.  We will hold off on ischemic evaluation as he does not have any chest pain or heart failure symptoms.  Cigarette smoker Tobacco cessation counseling: Currently smoking 1 cigarette/day Patient was informed of the dangers of tobacco abuse including stroke, cancer, and MI, as well as benefits of tobacco cessation. Patient is willing to quit at this time. 5 mins were spent counseling patient cessation techniques. We discussed various methods to help quit smoking, including deciding on a date to quit, joining a support group, pharmacological  agents- nicotine gum/patch/lozenges.  I will reassess his progress at the next follow-up visit   FINAL MEDICATION LIST END OF ENCOUNTER: No orders of the defined types were placed in this encounter.   Medications Discontinued During This Encounter  Medication Reason   amoxicillin-clavulanate (AUGMENTIN) 875-125 MG tablet    clotrimazole (CLOTRIMAZOLE AF) 1 % cream    losartan-hydrochlorothiazide (HYZAAR) 100-25 MG tablet Change in therapy   diazepam (VALIUM) 5 MG tablet      Current Outpatient Medications:    amLODipine (NORVASC) 10 MG tablet, Take 1 tablet (10 mg total) by mouth daily., Disp: 90 tablet, Rfl: 3   Cannabidiol POWD, Apply 1 application topically 3 (three) times daily as needed (pain.). CBD Pain Relief Roll-O, Disp: , Rfl:    cetirizine (ZYRTEC) 10 MG tablet, Take 10 mg by mouth every other day. , Disp: , Rfl:    cyclobenzaprine (FLEXERIL) 10 MG tablet, Take 10 mg by mouth 3 (three) times daily as needed for muscle spasms., Disp: , Rfl:    fluticasone (FLONASE) 50 MCG/ACT nasal spray, Place 2 sprays into both nostrils daily as needed (allergies.)., Disp: 16 g, Rfl: 2   hydrochlorothiazide (HYDRODIURIL) 25 MG tablet, Take 25 mg by mouth daily., Disp: , Rfl:    ibuprofen (ADVIL) 800 MG tablet, Take 1 tablet (800 mg total) by mouth every 8  (eight) hours as needed. (Patient taking differently: Take 800 mg by mouth every 8 (eight) hours as needed (pain).), Disp: 90 tablet, Rfl: 1   losartan (COZAAR) 50 MG tablet, Take 50 mg by mouth daily., Disp: , Rfl:    meloxicam (MOBIC) 15 MG tablet, Take 15 mg by mouth daily., Disp: , Rfl:    montelukast (SINGULAIR) 10 MG tablet, Take 1 tablet (10 mg total) by mouth at bedtime., Disp: 90 tablet, Rfl: 1   omeprazole (PRILOSEC) 20 MG capsule, Take 1 capsule (20 mg total) by mouth 2 (two) times daily before a meal. (Patient taking differently: Take 20 mg by mouth 2 (two) times daily as needed (acid reflux/indigestion.).), Disp: 180 capsule, Rfl: 0   sildenafil (VIAGRA) 50 MG tablet, Take 1 tablet (50 mg total) by mouth daily as needed for erectile dysfunction., Disp: 10 tablet, Rfl: 1   tamsulosin (FLOMAX) 0.4 MG CAPS capsule, Take 0.4 mg by mouth daily., Disp: , Rfl:    Diclofenac Sodium (PENNSAID) 2 % SOLN, Place 1 application onto the skin 2 (two) times daily. (Patient not taking: Reported on 05/29/2021), Disp: 1 Bottle, Rfl: 3  Orders Placed This Encounter  Procedures   EKG 12-Lead   PCV ECHOCARDIOGRAM COMPLETE   PCV ANKLE BRACHIAL INDEX (ABI)    There are no Patient Instructions on file for this visit.   --Continue cardiac medications as reconciled in final medication list. --Return in about 1 year (around 05/29/2022) for annual follow up . Or sooner if needed. --Continue follow-up with your primary care physician regarding the management of your other chronic comorbid conditions.  Patient's questions and concerns were addressed to his satisfaction. He voices understanding of the instructions provided during this encounter.   This note was created using a voice recognition software as a result there may be grammatical errors inadvertently enclosed that do not reflect the nature of this encounter. Every attempt is made to correct such errors.  Marcus Armstrong, [place], [facility]  Pager:  [phone] Office: [phone]

## 2021-06-03 ENCOUNTER — Other Ambulatory Visit: Payer: BC Managed Care – PPO

## 2021-06-04 NOTE — Telephone Encounter (Signed)
From pt

## 2021-06-05 ENCOUNTER — Other Ambulatory Visit: Payer: Self-pay

## 2021-06-05 ENCOUNTER — Ambulatory Visit: Payer: BC Managed Care – PPO

## 2021-06-05 ENCOUNTER — Other Ambulatory Visit: Payer: BC Managed Care – PPO

## 2021-06-05 DIAGNOSIS — I10 Essential (primary) hypertension: Secondary | ICD-10-CM | POA: Diagnosis not present

## 2021-06-10 NOTE — Telephone Encounter (Signed)
1st no show, letter sent ?

## 2021-06-12 NOTE — Progress Notes (Signed)
Called and spoke to pt, pt voiced understanding.
